# Patient Record
Sex: Male | Born: 1955 | Race: White | Hispanic: No | Smoking: Never smoker
Health system: Southern US, Community
[De-identification: ages and names within clinical notes are randomized; demographics above are authoritative.]

## PROBLEM LIST (undated history)

## (undated) DIAGNOSIS — R972 Elevated prostate specific antigen [PSA]: Secondary | ICD-10-CM

## (undated) DIAGNOSIS — R011 Cardiac murmur, unspecified: Secondary | ICD-10-CM

## (undated) DIAGNOSIS — E785 Hyperlipidemia, unspecified: Secondary | ICD-10-CM

## (undated) DIAGNOSIS — D72819 Decreased white blood cell count, unspecified: Secondary | ICD-10-CM

## (undated) DIAGNOSIS — G5600 Carpal tunnel syndrome, unspecified upper limb: Secondary | ICD-10-CM

## (undated) DIAGNOSIS — I10 Essential (primary) hypertension: Secondary | ICD-10-CM

## (undated) HISTORY — DX: Elevated prostate specific antigen (PSA): R97.20

## (undated) HISTORY — DX: Carpal tunnel syndrome, unspecified upper limb: G56.00

## (undated) HISTORY — DX: Decreased white blood cell count, unspecified: D72.819

## (undated) HISTORY — DX: Cardiac murmur, unspecified: R01.1

## (undated) HISTORY — PX: ROTATOR CUFF REPAIR: SHX139

## (undated) HISTORY — DX: Essential (primary) hypertension: I10

## (undated) HISTORY — DX: Hyperlipidemia, unspecified: E78.5

## (undated) HISTORY — PX: KNEE ARTHROSCOPY: SUR90

---
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
## 2009-09-05 ENCOUNTER — Encounter: Admission: RE | Admit: 2009-09-05 | Discharge: 2009-09-05 | Payer: Self-pay | Admitting: *Deleted

## 2012-03-18 ENCOUNTER — Encounter: Payer: Self-pay | Admitting: *Deleted

## 2013-10-22 ENCOUNTER — Ambulatory Visit (INDEPENDENT_AMBULATORY_CARE_PROVIDER_SITE_OTHER): Payer: BC Managed Care – PPO | Admitting: Urology

## 2013-10-22 ENCOUNTER — Encounter (INDEPENDENT_AMBULATORY_CARE_PROVIDER_SITE_OTHER): Payer: Self-pay

## 2013-10-22 DIAGNOSIS — N400 Enlarged prostate without lower urinary tract symptoms: Secondary | ICD-10-CM

## 2015-07-16 ENCOUNTER — Ambulatory Visit
Admission: EM | Admit: 2015-07-16 | Discharge: 2015-07-16 | Disposition: A | Discharge status: Home / Self Care | Payer: BLUE CROSS/BLUE SHIELD | Attending: Internal Medicine | Admitting: Internal Medicine

## 2015-07-16 ENCOUNTER — Ambulatory Visit: Payer: BLUE CROSS/BLUE SHIELD

## 2015-07-16 ENCOUNTER — Encounter: Payer: Self-pay | Admitting: *Deleted

## 2015-07-16 DIAGNOSIS — S99811A Other specified injuries of right ankle, initial encounter: Secondary | ICD-10-CM | POA: Diagnosis present

## 2015-07-16 DIAGNOSIS — I38 Endocarditis, valve unspecified: Secondary | ICD-10-CM | POA: Insufficient documentation

## 2015-07-16 DIAGNOSIS — I10 Essential (primary) hypertension: Secondary | ICD-10-CM | POA: Insufficient documentation

## 2015-07-16 DIAGNOSIS — S92101A Unspecified fracture of right talus, initial encounter for closed fracture: Secondary | ICD-10-CM | POA: Diagnosis not present

## 2015-07-16 DIAGNOSIS — W11XXXA Fall on and from ladder, initial encounter: Secondary | ICD-10-CM | POA: Insufficient documentation

## 2015-07-16 DIAGNOSIS — R972 Elevated prostate specific antigen [PSA]: Secondary | ICD-10-CM | POA: Diagnosis not present

## 2015-07-16 DIAGNOSIS — G5600 Carpal tunnel syndrome, unspecified upper limb: Secondary | ICD-10-CM | POA: Insufficient documentation

## 2015-07-16 DIAGNOSIS — E785 Hyperlipidemia, unspecified: Secondary | ICD-10-CM | POA: Insufficient documentation

## 2015-07-16 MED ORDER — IBUPROFEN 800 MG PO TABS
800.0000 mg | ORAL_TABLET | Freq: Once | ORAL | Status: AC
  Administered 2015-07-16: 800 mg via ORAL

## 2015-07-16 MED ORDER — NAPROXEN 500 MG PO TABS: 500.0000 mg | ORAL_TABLET | Freq: Two times a day (BID) | ORAL | Status: DC

## 2015-07-16 MED ORDER — HYDROCODONE-ACETAMINOPHEN 5-325 MG PO TABS: 2.0000 | ORAL_TABLET | ORAL | Status: DC | PRN

## 2015-07-16 NOTE — ED Notes (Signed)
Pt states "fell from ladder about 30 minutes ago and injured right ankle and scratched left leg"

## 2015-07-16 NOTE — Discharge Instructions (Signed)
Do not put weight on your right ankle until cleared by orthopedist to do so. Use crutches and wear splint at all times. Ice and elevation should help manage swelling/pain. Prescriptions for naprosyn and a small number (10) of vicodin tablets were given today, for pain. Followup in the next few days with [facility] to discuss further management of right ankle fracture.

## 2015-07-16 NOTE — ED Provider Notes (Signed)
CSN: [id number]     Arrival date & time 07/16/15  1501 History   First MD Initiated Contact with Patient 07/16/15 1544     Chief Complaint  Patient presents with  . Ankle Injury   HPI  Patient is a 59 year old gentleman who was up about 10 feet on a ladder, working on a tree branches, earlier today. The ladder slid, fell, striking a fence and patient landed with most of his weight on his right foot. Ankle is painful and swollen, and he is not able to bear weight. He has some scraped areas on both legs, and a swollen area on the left mid shin. He did not hit his head, and has no neck or back pain.  Past Medical History  Diagnosis Date  . Hypertension   . Coarctation of aorta     age 36  . Hyperlipidemia   . Leukopenia     mild  . Elevated PSA   . Heart murmur     systolic  . Carpal tunnel syndrome    Past Surgical History  Procedure Laterality Date  . Knee arthroscopy    . Rotator cuff repair      right  . Coarctation of aorta repair  06/1962   Family History  Problem Relation Age of Onset  . Pancreatic cancer Mother    History  Substance Use Topics  . Smoking status: Never Smoker   . Smokeless tobacco: Not on file  . Alcohol Use: 1.2 oz/week    1 Glasses of wine, 1 Cans of beer per week    Review of Systems  All other systems reviewed and are negative.   Allergies  Review of patient's allergies indicates no known allergies.  Home Medications  Takes no medications regularly  BP 140/78 mmHg  Pulse 62  Temp(Src) 98.1 F (36.7 C) (Oral)  Ht 5\' 10"  (1.778 m)  Wt 165 lb (74.844 kg)  BMI 23.68 kg/m2  SpO2 99% Physical Exam  Constitutional: He is oriented to person, place, and time. No distress.  Alert, nicely groomed  HENT:  Head: Atraumatic.  No hemotympanum, no bleeding from the nose.  Patient reports that teeth feel like they're in the right place. No injury to lips or tongue.  Eyes:  Conjugate gaze, no eye redness/drainage  Neck: Neck supple.   Cardiovascular: Normal rate.   Pulmonary/Chest: No respiratory distress.  Abdominal: Soft. He exhibits no distension.  Musculoskeletal: Normal range of motion.  Good range of motion of the right ankle, but swollen Focal tenderness over the medial malleolus, and around the lateral malleolus 3 inch hematoma over the left mid shin Range of motion of both shoulders, elbows, wrists, knees, hips, appears to be full and without pain No midline posterior tenderness to percussion along the neck or spine  Neurological: He is alert and oriented to person, place, and time.  Skin: Skin is warm and dry.  No cyanosis  Nursing note and vitals reviewed.   ED Course  Procedures (including critical care time) Labs Review Labs Reviewed - No data to display  Imaging Review Dg Tibia/fibula Left  07/16/2015   CLINICAL DATA:  Status post fall.  Left mid tibial/fib bruising.  EXAM: LEFT TIBIA AND FIBULA - 2 VIEW  COMPARISON:  None.  FINDINGS: There is no evidence of fracture or other focal bone lesions. Soft tissues are unremarkable.  IMPRESSION: No acute osseous injury of the left tibia or fibula.   Electronically Signed   By: Elige Ko  On: 07/16/2015 16:26   Dg Ankle Complete Right  07/16/2015   CLINICAL DATA:  Fall from ladder.  EXAM: RIGHT ANKLE - COMPLETE 3+ VIEW  COMPARISON:  None.  FINDINGS: There is a mildly displaced fracture of the lateral corner of the talar dome. There is no other fracture or dislocation. The soft tissues are normal.  IMPRESSION: Mildly displaced fracture of the lateral corner of the talar dome.   Electronically Signed   By: Elige Ko   On: 07/16/2015 16:26   orthoglass posterior splint applied to R ankle per nursing staff.  MDM   1. Talar fracture, right, closed, initial encounter    Pt is to be non weight bearing until cleared by orthopedist to bear weight. Wear splint and use crutches; followup with [facility] in the next few days. Copy of xray on disc  given to patient; rx for naproxen and small number (10 tablets) of vicodin given. Ice and elevation should help decrease swelling and pain.   Eustace Moore, MD 07/16/15 [phone]

## 2015-07-18 ENCOUNTER — Ambulatory Visit
Admission: RE | Admit: 2015-07-18 | Discharge: 2015-07-18 | Disposition: A | Discharge status: Home / Self Care | Payer: BLUE CROSS/BLUE SHIELD | Source: Ambulatory Visit | Attending: Sports Medicine | Admitting: Sports Medicine

## 2015-07-18 ENCOUNTER — Other Ambulatory Visit: Payer: Self-pay | Admitting: Sports Medicine

## 2015-07-18 DIAGNOSIS — S82891A Other fracture of right lower leg, initial encounter for closed fracture: Secondary | ICD-10-CM

## 2016-04-21 IMAGING — CT CT ANKLE*R* W/O CM
3 series · 8 of 14 positions shown, 9 images · non-contrast
Comparison: None.

CLINICAL DATA: Close fracture of the right talus.

EXAM:
CT OF THE RIGHT ANKLE WITHOUT CONTRAST
TECHNIQUE: Multidetector CT imaging of the right ankle was performed according
to the standard protocol. Multiplanar CT image reconstructions were
also generated.

[Series 4: soft tissue lower extremity · axial · 0.37mm/px · z∈[+151,+247]mm · 4 of 80 slices shown]
[im 16/80  soft-tissue]
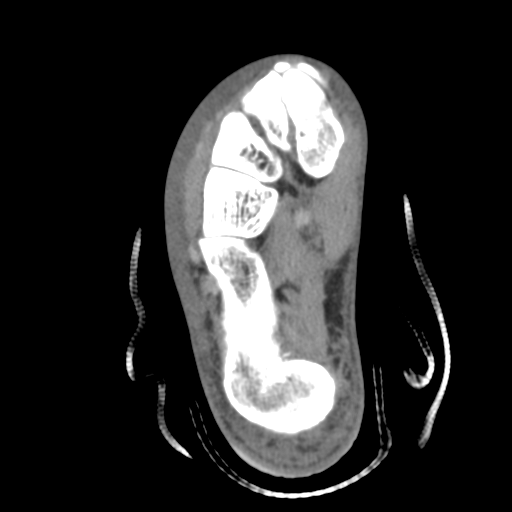
[im 32/80  soft-tissue]
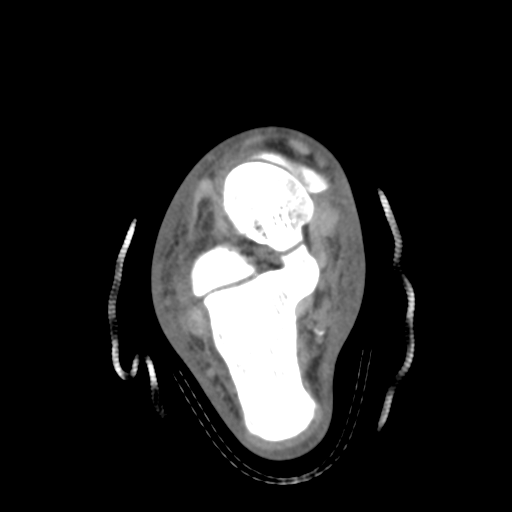
[im 48/80  soft-tissue]
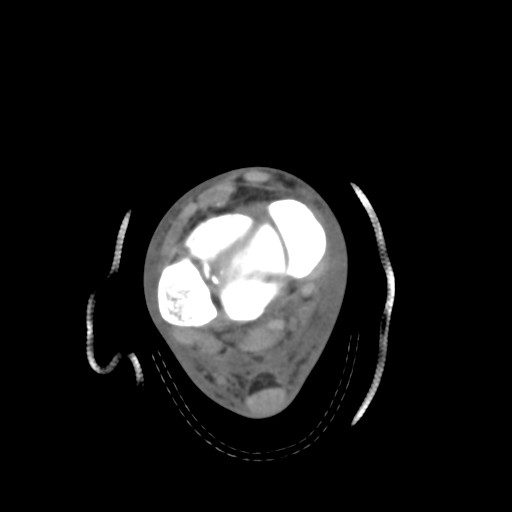
[im 64/80  soft-tissue]
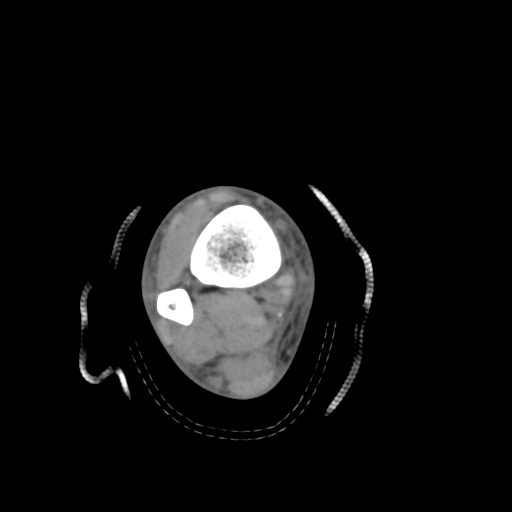

[Series 602: axial · axial · 0.37mm/px · z∈[+154,+194]mm · 2 of 60 slices shown (1 of 2)]
[im 20/60  bone]
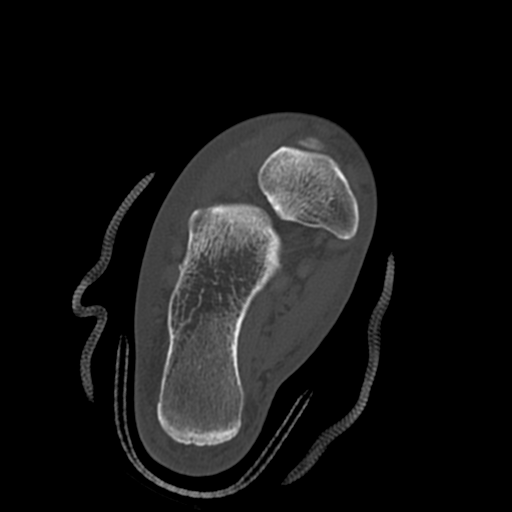
[im 40/60  bone]
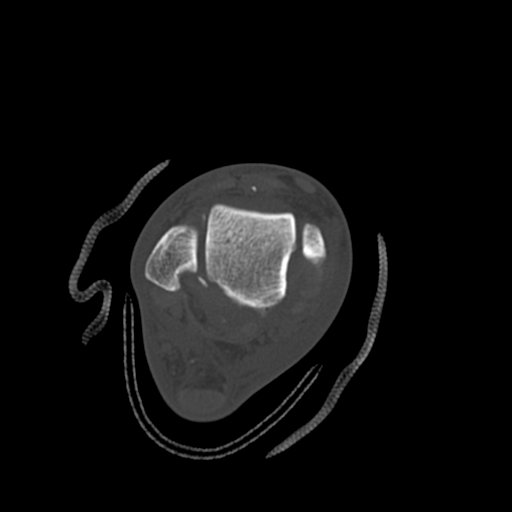

[Series 603: axial · axial · 0.37mm/px · z∈[+152,+194]mm · 2 of 64 slices shown, 3 images (2 of 2)]
[im 22/64  soft-tissue]
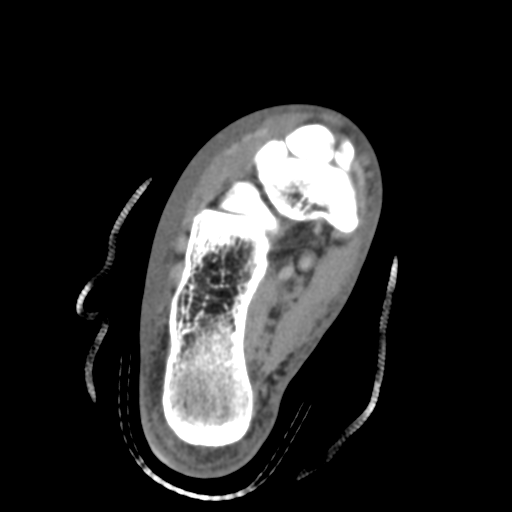
[im 22/64  bone]
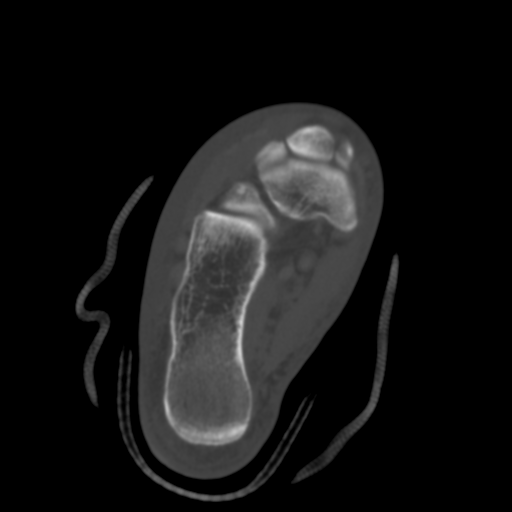
[im 43/64  bone]
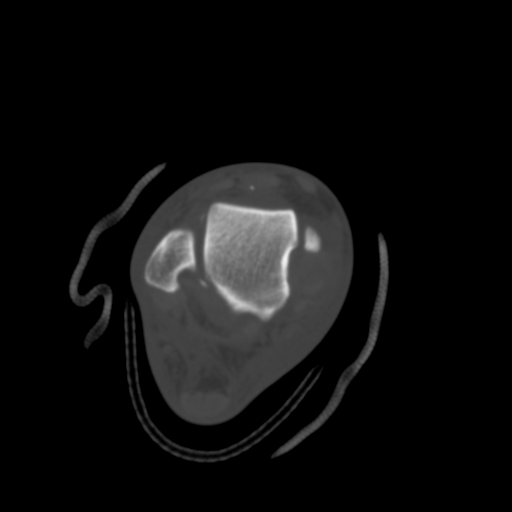

[8 of 14 positions shown; findings below may reference images not displayed]

FINDINGS: There is a mildly comminuted fracture of the lateral corner of the
talar dome with 2 mm of depression of the articular surface. There
is no other fracture or dislocation. There is a punctate loose body
displaced into the posterior tibiotalar joint. There is a moderate
ankle joint effusion.

There is no other fracture or dislocation. The joint spaces are
maintained. Ankle mortise is maintained. The subtalar joints are
normal. There is a small plantar calcaneal spur.

There is circumferential soft tissue edema around the ankle. There
is peripheral vascular atherosclerotic disease. The flexor, extensor
and peroneal tendons are grossly intact.
IMPRESSION: 1. Mildly comminuted fracture of the lateral corner of the talar
dome with 2 mm of depression of the articular surface.

## 2019-11-26 ENCOUNTER — Other Ambulatory Visit: Payer: Self-pay

## 2019-11-26 DIAGNOSIS — Z20822 Contact with and (suspected) exposure to covid-19: Secondary | ICD-10-CM

## 2019-11-28 LAB — NOVEL CORONAVIRUS, NAA: SARS-CoV-2, NAA: NOT DETECTED

## 2019-11-29 ENCOUNTER — Telehealth: Payer: Self-pay

## 2019-11-29 NOTE — Telephone Encounter (Signed)
Patient given negative result and verbalized understanding  

## 2020-02-02 ENCOUNTER — Other Ambulatory Visit: Payer: Self-pay

## 2020-02-02 ENCOUNTER — Ambulatory Visit: Payer: BLUE CROSS/BLUE SHIELD | Attending: Internal Medicine

## 2020-02-02 DIAGNOSIS — Z20822 Contact with and (suspected) exposure to covid-19: Secondary | ICD-10-CM

## 2020-02-03 ENCOUNTER — Telehealth: Payer: Self-pay | Admitting: *Deleted

## 2020-02-03 ENCOUNTER — Ambulatory Visit: Payer: Self-pay

## 2020-02-03 LAB — NOVEL CORONAVIRUS, NAA: SARS-CoV-2, NAA: NOT DETECTED

## 2020-02-03 NOTE — Telephone Encounter (Signed)
Incoming call form Patient requesting covid testing results.  Not available.

## 2020-02-03 NOTE — Telephone Encounter (Signed)
Pt calling with questions regarding covid results. Confirmed 'not detected' negative. Pt verbalizes understanding.

## 2021-01-05 ENCOUNTER — Other Ambulatory Visit: Payer: Medicare Other

## 2021-01-05 DIAGNOSIS — Z20822 Contact with and (suspected) exposure to covid-19: Secondary | ICD-10-CM

## 2021-01-09 LAB — NOVEL CORONAVIRUS, NAA: SARS-CoV-2, NAA: DETECTED — AB

## 2021-01-10 ENCOUNTER — Telehealth: Payer: Self-pay

## 2021-01-10 NOTE — Telephone Encounter (Signed)
Called to discuss with patient about COVID-19 symptoms and the use of one of the available treatments for those with mild to moderate Covid symptoms and at Harvey high risk of hospitalization.  Pt appears to qualify for outpatient treatment due to co-morbid conditions and/or Harvey member of an at-risk group in accordance with the FDA Emergency Use Authorization.    Symptom onset: 01/04/21 Vaccinated: No Booster? No Immunocompromised? No Qualifiers: None  Unable to reach pt - Reached pt.   Austin Harvey   Pt. Out of 7 day window for treatment. States he is feeling better.

## 2022-02-05 ENCOUNTER — Other Ambulatory Visit: Payer: Self-pay | Admitting: Sports Medicine

## 2022-04-18 ENCOUNTER — Encounter: Payer: Self-pay | Admitting: Cardiology

## 2022-04-18 ENCOUNTER — Ambulatory Visit (INDEPENDENT_AMBULATORY_CARE_PROVIDER_SITE_OTHER): Payer: Medicare Other | Admitting: Cardiology

## 2022-04-18 DIAGNOSIS — Q251 Coarctation of aorta: Secondary | ICD-10-CM | POA: Diagnosis not present

## 2022-04-18 DIAGNOSIS — I2510 Atherosclerotic heart disease of native coronary artery without angina pectoris: Secondary | ICD-10-CM | POA: Diagnosis not present

## 2022-04-18 DIAGNOSIS — Z01810 Encounter for preprocedural cardiovascular examination: Secondary | ICD-10-CM | POA: Diagnosis not present

## 2022-04-18 DIAGNOSIS — I2584 Coronary atherosclerosis due to calcified coronary lesion: Secondary | ICD-10-CM

## 2022-04-18 NOTE — Assessment & Plan Note (Signed)
He may proceed with total hip replacement with low overall cardiac risk.  He is able to complete 4 METS of activity without difficulty. ?

## 2022-04-18 NOTE — Assessment & Plan Note (Signed)
I recommended aspirin 81 mg as well as Crestor 10 mg.  Would recheck lipid panel in 2 months after starting Crestor.  Explained and plaque stabilization.  Heart attack reduction.  At this time he may consider aspirin 81 he states.  He will hold off on Crestor.  Please consider in the future. ?

## 2022-04-18 NOTE — Assessment & Plan Note (Signed)
CT scan from 2020 reviewed as above.  Mild narrowing of the aorta noted.  No murmurs heard on exam.  Pulses are equal upper and lower body.  No excessive hypertension.  I do not hear a aortic valve murmur.  Offered him another evaluation with CT scan.  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  At this time he declined.  We could also use echocardiogram to look at the aortic valve structure.  At this time he declined.  He believes that he has had an ultrasound of the aorta done in the past. ?

## 2022-04-18 NOTE — Patient Instructions (Signed)
Medication Instructions:  Your physician recommends that you continue on your current medications as directed. Please refer to the Current Medication list given to you today.   Labwork: None today  Testing/Procedures: None today  Follow-Up: As needed  Any Other Special Instructions Will Be Listed Below (If Applicable).  If you need a refill on your cardiac medications before your next appointment, please call your pharmacy.  

## 2022-04-18 NOTE — Progress Notes (Signed)
?Cardiology Office Note:   ? ?Date:  04/18/2022  ? ?ID:  Austin RanchScott W Bier, DOB 05-23-1956, MRN [id number] ? ?PCP:  Patient, No Pcp Per (Inactive) ?  ?[facility] Providers ?Cardiologist:  None    ? ?Referring MD: Linus Galasate, Katie, NP  ? ? ?History of Present Illness:   ? ?Austin Harvey is a [age] y.o. male here for preoperative risk assessment prior to left total hip arthroplasty by Dr. Lequita HaltAluisio.  Denies any chest pain syncope palpitations fatigue.  Prior EKG was performed and showed nonspecific changes when compared to 2021.  ST segment changes were noted in V5.  We were asked to assess cardiac risk prior to surgery. ? ?He is doing very well.  Able to complete greater than 4 METS of activity without any difficulty. ? ?EKG on repeat today with no significant abnormalities.  Subtle J-point elevation noted in the precordial leads.  Benign. ? ?[place] age 66 [place]  CT scan was performed in 2010.  Personally reviewed and interpreted.  Showed him images.  Mild narrowing, 25%, aortic diameter just after the left subclavian.  Correct repair intact.  I do not hear any murmurs on exam.  I do not hear an aortic murmur.  I did mention bicuspid aortic valve to him, he knew of the topic.  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 ? ?He is also had a coronary calcium score performed at [facility], score was 75 which was between 25th and 50th percentile.  Calcium was in the LAD and circumflex.  We discussed potential treatment strategies including aspirin as well as Crestor.  His sister who has a high LDL and is very athletic has a score of 0. ? ?Past Medical History:  ?Diagnosis Date  ? Carpal tunnel syndrome   ? Coarctation of aorta   ? age 89  ? Elevated PSA   ? Heart murmur   ? systolic  ? Hyperlipidemia   ? Hypertension   ? Leukopenia   ? mild  ? ? ?Past Surgical History:  ?Procedure Laterality Date  ? COARCTATION OF AORTA REPAIR  06/1962  ? KNEE  ARTHROSCOPY    ? ROTATOR CUFF REPAIR    ? right  ? ? ?Current Medications: ?No outpatient medications have been marked as taking for the 04/18/22 encounter (Office Visit) with Jake BatheSkains,  C, MD.  ?  ? ?Allergies:   Patient has no known allergies.  ? ?Social History  ? ?Socioeconomic History  ? Marital status: Divorced  ?  Spouse name: Not on file  ? Number of children: Not on file  ? Years of education: Not on file  ? Highest education level: Not on file  ?Occupational History  ? Not on file  ?Tobacco Use  ? Smoking status: Never  ? Smokeless tobacco: Not on file  ?Vaping Use  ? Vaping Use: Never used  ?Substance and Sexual Activity  ? Alcohol use: Yes  ?  Alcohol/week: 2.0 standard drinks  ?  Types: 1 Glasses of wine, 1 Cans of beer per week  ? Drug use: No  ? Sexual activity: Not on file  ?Other Topics Concern  ? Not on file  ?Social History Narrative  ? Not on file  ? ?Social Determinants of Health  ? ?Financial Resource Strain: Not on file  ?Food Insecurity: Not on file  ?Transportation Needs: Not on file  ?Physical Activity: Not on file  ?Stress: Not on file  ?  Social Connections: Not on file  ?  ? ?Family History: ?The patient's family history includes Cancer in his father; Pancreatic cancer in his mother; Stroke in his father. ? ?ROS:   ?Please see the history of present illness.    ? All other systems reviewed and are negative. ? ?EKGs/Labs/Other Studies Reviewed:   ? ?The following studies were reviewed today: ?As above ? ?EKG:  EKG is  ordered today.  The ekg ordered today demonstrates sinus rhythm 68 with subtle J-point elevation precordial leads ? ?Recent Labs: ?No results found for requested labs within last 8760 hours.  ?Recent Lipid Panel ?No results found for: CHOL, TRIG, HDL, CHOLHDL, VLDL, LDLCALC, LDLDIRECT ? ? ?Risk Assessment/Calculations:   ? ? ?    ? ?   ? ?Physical Exam:   ? ?VS:  BP 140/80   Pulse 68   Ht 5\' 10"  (1.778 m)   Wt 151 lb 9.6 oz (68.8 kg)   SpO2 98%   BMI 21.75 kg/m?    ? ?Wt  Readings from Last 3 Encounters:  ?04/18/22 151 lb 9.6 oz (68.8 kg)  ?07/16/15 165 lb (74.8 kg)  ?  ? ?GEN: Well nourished, well developed in no acute distress ?HEENT: Normal ?NECK: No JVD; No carotid bruits ?LYMPHATICS: No lymphadenopathy ?CARDIAC: RRR, no murmurs, no rubs, gallops ?RESPIRATORY:  Clear to auscultation without rales, wheezing or rhonchi  ?ABDOMEN: Soft, non-tender, non-distended ?MUSCULOSKELETAL:  No edema; No deformity  ?SKIN: Warm and dry ?NEUROLOGIC:  Alert and oriented x 3 ?PSYCHIATRIC:  Normal affect  ? ?ASSESSMENT:   ? ?1. Preop cardiovascular exam   ?2. Coronary artery calcification   ?3. Coarctation of the aorta, complex   ? ?PLAN:   ? ?In order of problems listed above: ? ?Preop cardiovascular exam ?He may proceed with total hip replacement with low overall cardiac risk.  He is able to complete 4 METS of activity without difficulty. ? ?Coronary artery calcification ?I recommended aspirin 81 mg as well as Crestor 10 mg.  Would recheck lipid panel in 2 months after starting Crestor.  Explained and plaque stabilization.  Heart attack reduction.  At this time he may consider aspirin 81 he states.  He will hold off on Crestor.  Please consider in the future. ? ?Coarctation of the aorta, complex ?CT scan from 2020 reviewed as above.  Mild narrowing of the aorta noted.  No murmurs heard on exam.  Pulses are equal upper and lower body.  No excessive hypertension.  I do not hear a aortic valve murmur.  Offered him another evaluation with CT scan.  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  At this time he declined.  We could also use echocardiogram to look at the aortic valve structure.  At this time he declined.  He believes that he has had an ultrasound of the aorta done in the past. ?  ? ?Please let us know if  we can be of further assistance. ?  ? ? ?Medication Adjustments/Labs and Tests Ordered: ?Current medicines are reviewed at length with the patient today.  Concerns regarding medicines are outlined above.  ?No orders of the defined types were placed in this encounter. ? ?No orders of the defined types were placed in this encounter. ? ? ?Patient Instructions  ?  Medication Instructions:  ?Your physician recommends that you continue on your current medications as directed. Please refer to the Current Medication list given to you today. ? ? ?Labwork: ?None today ? ?Testing/Procedures: ?None today ? ?Follow-Up: ?As needed ? ?Any Other Special Instructions Will Be Listed Below (If Applicable). ? ?If you need a refill on your cardiac medications before your next appointment, please call your pharmacy. ?  ? ?Signed, ?Donato Schultz, MD  ?04/18/2022 2:37 PM    ?[facility] ?

## 2022-11-19 ENCOUNTER — Telehealth: Payer: Self-pay

## 2022-11-19 NOTE — Telephone Encounter (Signed)
...     Pre-operative Risk Assessment    Patient Name: Austin Harvey  DOB: Dec 04, 1956 MRN: [id number]      Request for Surgical Clearance    Procedure:   left total hip arthroplasty Left total hip arthroplasty Date of Surgery:  Clearance 12/24/22                                 Surgeon:  DR Ollen Gross Surgeon's Group or Practice Name:  [facility] Phone number:  [phone] Fax number:  [phone]   Type of Clearance Requested:   - Medical    Type of Anesthesia:   CHOICE   Additional requests/questions:    Jola Babinski   11/19/2022, 12:57 PM

## 2022-11-20 NOTE — Telephone Encounter (Signed)
Left message to call back for appt. Per pre op provider if pt would like in office appt that is fine or we can set up a tele pre op appt.

## 2022-11-20 NOTE — Telephone Encounter (Signed)
   Name: Austin Harvey  DOB: Nov 08, 1956  MRN: [id number]  Primary Cardiologist: None  Chart reviewed as part of pre-operative protocol coverage. Because of Austin Harvey's past medical history and time since last visit, he will require a follow-up phone-office visit in order to better assess preoperative cardiovascular risk. (Could be in office if he prefers)  Pre-op covering staff: - Please schedule appointment and call patient to inform them. If patient already had an upcoming appointment within acceptable timeframe, please add "pre-op clearance" to the appointment notes so provider is aware. - Please contact requesting surgeon's office via preferred method (i.e, phone, fax) to inform them of need for appointment prior to surgery.   Alver Sorrow, NP  11/20/2022, 8:38 AM

## 2022-11-25 ENCOUNTER — Telehealth: Payer: Self-pay | Admitting: *Deleted

## 2022-11-25 NOTE — Telephone Encounter (Signed)
I s/w the pt and he has been scheduled for a tele pre op appt 12/03/22 @ 9:20. Med rec and consent are done.     Patient Consent for Virtual Visit        Kroy Sprung Sevcik has provided verbal consent on 11/25/2022 for a virtual visit (video or telephone).   CONSENT FOR VIRTUAL VISIT FOR:  Bing Neighbors Malter  By participating in this virtual visit I agree to the following:  I hereby voluntarily request, consent and authorize [facility] and its employed or contracted physicians, physician assistants, nurse practitioners or other licensed health care professionals (the Practitioner), to provide me with telemedicine health care services (the "Services") as deemed necessary by the treating Practitioner. I acknowledge and consent to receive the Services by the Practitioner via telemedicine. I understand that the telemedicine visit will involve communicating with the Practitioner through live audiovisual communication technology and the disclosure of certain medical information by electronic transmission. I acknowledge that I have been given the opportunity to request an in-person assessment or other available alternative prior to the telemedicine visit and am voluntarily participating in the telemedicine visit.  I understand that I have the right to withhold or withdraw my consent to the use of telemedicine in the course of my care at any time, without affecting my right to future care or treatment, and that the Practitioner or I may terminate the telemedicine visit at any time. I understand that I have the right to inspect all information obtained and/or recorded in the course of the telemedicine visit and may receive copies of available information for a reasonable fee.  I understand that some of the potential risks of receiving the Services via telemedicine include:  Delay or interruption in medical evaluation due to technological equipment failure or disruption; Information transmitted may  not be sufficient (e.g. poor resolution of images) to allow for appropriate medical decision making by the Practitioner; and/or  In rare instances, security protocols could fail, causing a breach of personal health information.  Furthermore, I acknowledge that it is my responsibility to provide information about my medical history, conditions and care that is complete and accurate to the best of my ability. I acknowledge that Practitioner's advice, recommendations, and/or decision may be based on factors not within their control, such as incomplete or inaccurate data provided by me or distortions of diagnostic images or specimens that may result from electronic transmissions. I understand that the practice of medicine is not an exact science and that Practitioner makes no warranties or guarantees regarding treatment outcomes. I acknowledge that a copy of this consent can be made available to me via my patient portal [facility] MyChart), or I can request a printed copy by calling the office of [facility].    I understand that my insurance will be billed for this visit.   I have read or had this consent read to me. I understand the contents of this consent, which adequately explains the benefits and risks of the Services being provided via telemedicine.  I have been provided ample opportunity to ask questions regarding this consent and the Services and have had my questions answered to my satisfaction. I give my informed consent for the services to be provided through the use of telemedicine in my medical care

## 2022-11-25 NOTE — Telephone Encounter (Signed)
I s/w the pt and he has been scheduled for a tele pre op appt 12/03/22 @ 9:20. Med rec and consent are done.

## 2022-12-02 NOTE — Telephone Encounter (Signed)
Left a message for the pt to call the office as we need to reschedule his tele pre op appt. Provider is out of the office due to a death in the family.

## 2022-12-03 ENCOUNTER — Telehealth: Payer: BLUE CROSS/BLUE SHIELD

## 2022-12-03 NOTE — Telephone Encounter (Signed)
LATE ENTRY:    I s/w the pt and he has been scheduled for a tele pre op appt 12/03/22 @ 9:20. Med rec and consent are done.

## 2022-12-04 NOTE — Progress Notes (Unsigned)
Virtual Visit via Telephone Note   Because of Austin Harvey's co-morbid illnesses, he is at least at moderate risk for complications without adequate follow up.  This format is felt to be most appropriate for this patient at this time.  The patient did not have access to video technology/had technical difficulties with video requiring transitioning to audio format only (telephone).  All issues noted in this document were discussed and addressed.  No physical exam could be performed with this format.  Please refer to the patient's chart for his consent to telehealth for [facility]  Evaluation Performed:  Preoperative cardiovascular risk assessment _____________   Date:  12/05/2022   Patient ID:  Austin Harvey, DOB 02-10-1956, MRN [id number] Patient Location:  Home Provider location:   Office  Primary Care Provider:  Patient, No Pcp Per Primary Cardiologist:  Donato Schultz, MD  Chief Complaint / Patient Profile   66 y.o. y/o male with a h/o EKG with ST segment changes in V5 referred to cardiology to assess cardiac risk prior to surgery in April 2023 defined as subtle J-point elevation noted in precordial leads (benign) by Dr. Anne Fu, coarctation repair at age 85, coronary calcium score of 75 with calcium identified in LAD and circumflex, who is pending left total hip arthoplasty and presents today for telephonic preoperative cardiovascular risk assessment.  History of Present Illness    Austin Harvey is a 66 y.o. male who presents via audio/video conferencing for a telehealth visit today.  Pt was last seen in cardiology clinic on 04/18/22 by Dr. Anne Fu.  At that time Austin Harvey was doing well.  The patient is now pending procedure as outlined above. Since his last visit, he denies chest pain, shortness of breath, lower extremity edema, fatigue, palpitations, melena, hematuria, hemoptysis, diaphoresis, weakness, presyncope, syncope, orthopnea, and PND. He just  returned from a hiking trip and is easily able to achieve > 4 METS activity with no cardiac symptoms.   Past Medical History    Past Medical History:  Diagnosis Date   Carpal tunnel syndrome    Coarctation of aorta    age 1   Elevated PSA    Heart murmur    systolic   Hyperlipidemia    Hypertension    Leukopenia    mild   Past Surgical History:  Procedure Laterality Date   COARCTATION OF AORTA REPAIR  06/1962   KNEE ARTHROSCOPY     ROTATOR CUFF REPAIR     right    Allergies  No Known Allergies  Home Medications    Prior to Admission medications   Medication Sig Start Date End Date Taking? Authorizing Provider  HYDROcodone-acetaminophen (NORCO/VICODIN) 5-325 MG per tablet Take 2 tablets by mouth every 4 (four) hours as needed. Patient not taking: Reported on 04/18/2022 07/16/15   Isa Rankin, MD    Physical Exam    Vital Signs:  Austin Harvey does not have vital signs available for review today.  Given telephonic nature of communication, physical exam is limited. AAOx3. NAD. Normal affect.  Speech and respirations are unlabored.  Accessory Clinical Findings    None  Assessment & Plan    1.  Preoperative Cardiovascular Risk Assessment: The patient is doing well from a cardiac perspective. Therefore, based on ACC/AHA guidelines, the patient would be at acceptable risk for the planned procedure without further cardiovascular testing. According to the Revised Cardiac Risk Index (RCRI), his Perioperative Risk of Major Cardiac Event is (%):  0.9 His Functional Capacity in METs is: 7.59 according to the Duke Activity Status Index (DASI).  The patient was advised that if he develops new symptoms prior to surgery to contact our office to arrange for a follow-up visit, and he verbalized understanding.   A copy of this note will be routed to requesting surgeon.  Time:   Today, I have spent 5 minutes with the patient with telehealth technology discussing  medical history, symptoms, and management plan.    Austin Aland, NP-C  12/05/2022, 9:19 AM [address] Office [phone] Fax [phone]

## 2022-12-05 ENCOUNTER — Encounter: Payer: Self-pay | Admitting: Nurse Practitioner

## 2022-12-05 ENCOUNTER — Ambulatory Visit: Payer: Medicare Other | Attending: Internal Medicine | Admitting: Nurse Practitioner

## 2022-12-05 DIAGNOSIS — Z01810 Encounter for preprocedural cardiovascular examination: Secondary | ICD-10-CM

## 2022-12-06 ENCOUNTER — Other Ambulatory Visit (HOSPITAL_BASED_OUTPATIENT_CLINIC_OR_DEPARTMENT_OTHER): Payer: Self-pay | Admitting: Internal Medicine

## 2022-12-06 ENCOUNTER — Other Ambulatory Visit: Payer: Self-pay | Admitting: Internal Medicine

## 2022-12-06 DIAGNOSIS — R221 Localized swelling, mass and lump, neck: Secondary | ICD-10-CM

## 2022-12-12 ENCOUNTER — Ambulatory Visit (HOSPITAL_BASED_OUTPATIENT_CLINIC_OR_DEPARTMENT_OTHER): Payer: Medicare Other

## 2022-12-12 ENCOUNTER — Encounter (HOSPITAL_BASED_OUTPATIENT_CLINIC_OR_DEPARTMENT_OTHER): Payer: Self-pay

## 2022-12-14 ENCOUNTER — Encounter (HOSPITAL_BASED_OUTPATIENT_CLINIC_OR_DEPARTMENT_OTHER): Payer: Self-pay | Admitting: Emergency Medicine

## 2022-12-14 ENCOUNTER — Emergency Department (HOSPITAL_BASED_OUTPATIENT_CLINIC_OR_DEPARTMENT_OTHER)
Admission: EM | Admit: 2022-12-14 | Discharge: 2022-12-14 | Discharge status: Against Medical Advice | Payer: Medicare Other | Attending: Emergency Medicine | Admitting: Emergency Medicine

## 2022-12-14 DIAGNOSIS — R059 Cough, unspecified: Secondary | ICD-10-CM | POA: Diagnosis present

## 2022-12-14 DIAGNOSIS — B974 Respiratory syncytial virus as the cause of diseases classified elsewhere: Secondary | ICD-10-CM | POA: Insufficient documentation

## 2022-12-14 DIAGNOSIS — Z20822 Contact with and (suspected) exposure to covid-19: Secondary | ICD-10-CM | POA: Diagnosis not present

## 2022-12-14 DIAGNOSIS — J988 Other specified respiratory disorders: Secondary | ICD-10-CM | POA: Diagnosis not present

## 2022-12-14 LAB — RESP PANEL BY RT-PCR (RSV, FLU A&B, COVID)  RVPGX2
Influenza A by PCR: NEGATIVE
Influenza B by PCR: NEGATIVE
Resp Syncytial Virus by PCR: POSITIVE — AB
SARS Coronavirus 2 by RT PCR: NEGATIVE

## 2022-12-14 NOTE — ED Triage Notes (Signed)
Pt here from home with c/o cough times one week , no fevers or sob

## 2023-01-03 ENCOUNTER — Ambulatory Visit (HOSPITAL_BASED_OUTPATIENT_CLINIC_OR_DEPARTMENT_OTHER)
Admission: RE | Admit: 2023-01-03 | Discharge: 2023-01-03 | Disposition: A | Discharge status: Home / Self Care | Payer: Medicare Other | Source: Ambulatory Visit | Attending: Internal Medicine | Admitting: Internal Medicine

## 2023-01-03 DIAGNOSIS — R221 Localized swelling, mass and lump, neck: Secondary | ICD-10-CM | POA: Diagnosis present

## 2023-01-03 MED ORDER — IOHEXOL 300 MG/ML  SOLN
100.0000 mL | Freq: Once | INTRAMUSCULAR | Status: AC | PRN
  Administered 2023-01-03: 75 mL via INTRAVENOUS

## 2023-02-25 ENCOUNTER — Ambulatory Visit (HOSPITAL_BASED_OUTPATIENT_CLINIC_OR_DEPARTMENT_OTHER): Payer: Medicare Other | Admitting: Physical Therapy

## 2023-03-19 ENCOUNTER — Ambulatory Visit (HOSPITAL_BASED_OUTPATIENT_CLINIC_OR_DEPARTMENT_OTHER): Payer: Medicare Other | Admitting: Physical Therapy

## 2023-03-19 ENCOUNTER — Encounter (HOSPITAL_BASED_OUTPATIENT_CLINIC_OR_DEPARTMENT_OTHER): Payer: Self-pay

## 2023-07-01 ENCOUNTER — Telehealth: Payer: Self-pay | Admitting: *Deleted

## 2023-07-01 NOTE — Telephone Encounter (Signed)
   Name: LEXTON HIDALGO  DOB: 1956-04-27  MRN: [id number]  Primary Cardiologist: Donato Schultz, MD  Chart reviewed as part of pre-operative protocol coverage. Because of Arieh Bogue Matsuoka's past medical history and time since last visit, he will require a follow-up in-office visit in order to better assess preoperative cardiovascular risk.  Pre-op covering staff: - Please schedule appointment and call patient to inform them. If patient already had an upcoming appointment within acceptable timeframe, please add "pre-op clearance" to the appointment notes so provider is aware. - Please contact requesting surgeon's office via preferred method (i.e, phone, fax) to inform them of need for appointment prior to surgery.   Carlos Levering, NP  07/01/2023, 2:56 PM

## 2023-07-01 NOTE — Telephone Encounter (Signed)
   Pre-operative Risk Assessment    Patient Name: Austin Harvey  DOB: March 16, 1956 MRN: [id number]      Request for Surgical Clearance    Procedure:   LEFT TOTAL HIP ARTHROPLASTY  Date of Surgery:  Clearance 09/30/23                                 Surgeon:  DR. Ollen Gross Surgeon's Group or Practice Name:  [facility] Phone number:  [phone] ATTN: Aida Raider Fax number:  [phone]   Type of Clearance Requested:   - Medical ; NO MEDICATIONS LISTED AS NEEDING TO BE HELD   Type of Anesthesia:   CHOICE   Additional requests/questions:    [name]   07/01/2023, 12:46 PM

## 2023-07-02 NOTE — Telephone Encounter (Signed)
1st attempt to reach pt regarding surgical clearance and the need for an IN-OFFICE appointment.  Left pt a message to call back and get that scheduled. 

## 2023-07-03 NOTE — Telephone Encounter (Signed)
2nd attempt to reach pt regarding surgical clearance and the need for an IN-OFFICE appointment.  Left pt a message to call back and get that scheduled.

## 2023-07-04 NOTE — Telephone Encounter (Signed)
Patient has been scheduled for an in office appointment for pre op clearance on 07/25/23. Will route back to requesting surgeons office to make them aware.

## 2023-07-24 NOTE — Progress Notes (Signed)
Office Visit    Patient Name: CAMDON SAETERN Date of Encounter: 07/24/2023  Primary Care Provider:  Merri Brunette, MD Primary Cardiologist:  Donato Schultz, MD Primary Electrophysiologist: None   Past Medical History    Past Medical History:  Diagnosis Date   Carpal tunnel syndrome    Coarctation of aorta    age 67   Elevated PSA    Heart murmur    systolic   Hyperlipidemia    Hypertension    Leukopenia    mild   Past Surgical History:  Procedure Laterality Date   COARCTATION OF AORTA REPAIR  06/1962   KNEE ARTHROSCOPY     ROTATOR CUFF REPAIR     right    Allergies  No Known Allergies   History of Present Illness    QUINCEY QUESINBERRY  is a 67 year old male with a PMH of congenital abnormality s/p coarctation repair at age 31, HLD, HTN, systolic murmur, SCC of left upper neck mass who presents today for preoperative clearance visit.  Mr. Sol Passer was seen initially by Dr. Anne Fu in 03/2022 for preoperative clearance visit prior to left total hip replacement.  Today he presents for preoperative clearance for hip replacement.  Patient had most recent calcium score completed at [facility] with a score of 75 he was noted to have some J-point elevations in precordial leads that were determined to be benign.  He was diagnosed with squamous cell carcinoma of left tonsil and underwent tonsillectomy neck node dissection with most recent PET/CT showing no evidence of recurrence.  Since last being seen in the office patient reports doing well with no new cardiac complaints since his previous visit.  He is scheduled to undergo left total hip replacement.  He was previously scheduled to undergo procedure but had other health issues that occurred causing rescheduling.  His blood pressure today is controlled at 124/62 and HR is 58 BPM.  He is staying active but is limited with pain.  He is currently not on any additional supplements or vitamins.  Patient denies chest pain, palpitations,  dyspnea, PND, orthopnea, nausea, vomiting, dizziness, syncope, edema, weight gain, or early satiety.    No current outpatient medications on file.   No current facility-administered medications for this visit.     Review of Systems  Please see the history of present illness.    (+) Left hip pain  All other systems reviewed and are otherwise negative except as noted above.  Physical Exam    Wt Readings from Last 3 Encounters:  04/18/22 151 lb 9.6 oz (68.8 kg)  07/16/15 165 lb (74.8 kg)   ZO:[id number] were no vitals filed for this visit.,There is no height or weight on file to calculate BMI.  Constitutional:      Appearance: Healthy appearance. Not in distress.  Neck:     Vascular: JVD normal.  Pulmonary:     Effort: Pulmonary effort is normal.     Breath sounds: No wheezing. No rales. Diminished in the bases Cardiovascular:     Normal rate. Regular rhythm. Normal S1. Normal S2.      Murmurs: There is no murmur.  Edema:    Peripheral edema absent.  Abdominal:     Palpations: Abdomen is soft non tender. There is no hepatomegaly.  Skin:    General: Skin is warm and dry.  Neurological:     General: No focal deficit present.     Mental Status: Alert and oriented to person, place and  time.     Cranial Nerves: Cranial nerves are intact.  EKG/LABS/ Recent Cardiac Studies    ECG personally reviewed by me today -sinus bradycardia with first-degree AVB J-point elevation in leads V4, V5 and V6 which are benign and consistent with previous EKG.    No results found for: "WBC", "HGB", "HCT", "MCV", "PLT" No results found for: "CREATININE", "BUN", "NA", "K", "CL", "CO2" No results found for: "ALT", "AST", "GGT", "ALKPHOS", "BILITOT" No results found for: "CHOL", "HDL", "LDLCALC", "LDLDIRECT", "TRIG", "CHOLHDL"  No results found for: "HGBA1C"   Assessment & Plan    1.  Preoperative cardiac clearance: -Patient's RCRI score is 0.9%  The patient affirms he has been doing well without  any new cardiac symptoms. They are able to achieve 7 METS without cardiac limitations. Therefore, based on ACC/AHA guidelines, the patient would be at acceptable risk for the planned procedure without further cardiovascular testing. The patient was advised that if he develops new symptoms prior to surgery to contact our office to arrange for a follow-up visit, and he verbalized understanding.   2.  History of coarctation repair: -Completed at age 35 in [place], [place] -Today patient is doing well with no new cardiac complaints.  3.  History of SCC of tonsil: -s/p left tonsillectomy with node dissection most recent PET/CT showing no evidence of recurrence. -Followed by oncology  4.  Essential hypertension: -Patient's blood pressure today was well-controlled at 124/62  5.  Hyperlipidemia: -Patient's last LDL cholesterol was previously elevated at 122 and currently followed by PCP  Disposition: Follow-up with Donato Schultz, MD or APP in as needed months    Medication Adjustments/Labs and Tests Ordered: Current medicines are reviewed at length with the patient today.  Concerns regarding medicines are outlined above.   Signed, Napoleon Form, Leodis Rains, NP 07/24/2023, 9:59 AM [facility]

## 2023-07-25 ENCOUNTER — Encounter: Payer: Self-pay | Admitting: Nurse Practitioner

## 2023-07-25 ENCOUNTER — Ambulatory Visit: Payer: Medicare Other | Attending: Nurse Practitioner | Admitting: Nurse Practitioner

## 2023-07-25 VITALS — BP 124/62 | HR 58 | Ht 70.0 in | Wt 151.0 lb

## 2023-07-25 DIAGNOSIS — I2510 Atherosclerotic heart disease of native coronary artery without angina pectoris: Secondary | ICD-10-CM | POA: Insufficient documentation

## 2023-07-25 DIAGNOSIS — Z01810 Encounter for preprocedural cardiovascular examination: Secondary | ICD-10-CM | POA: Insufficient documentation

## 2023-07-25 DIAGNOSIS — R9431 Abnormal electrocardiogram [ECG] [EKG]: Secondary | ICD-10-CM | POA: Insufficient documentation

## 2023-07-25 DIAGNOSIS — C099 Malignant neoplasm of tonsil, unspecified: Secondary | ICD-10-CM | POA: Diagnosis present

## 2023-07-25 DIAGNOSIS — I2584 Coronary atherosclerosis due to calcified coronary lesion: Secondary | ICD-10-CM | POA: Diagnosis present

## 2023-07-25 DIAGNOSIS — Q251 Coarctation of aorta: Secondary | ICD-10-CM | POA: Insufficient documentation

## 2023-07-25 NOTE — Patient Instructions (Signed)
Medication Instructions:  Your physician recommends that you continue on your current medications as directed. Please refer to the Current Medication list given to you today. *If you need a refill on your cardiac medications before your next appointment, please call your pharmacy*   Lab Work: None ordered   Testing/Procedures: None ordered   Follow-Up: At [facility], you and your health needs are our priority.  As part of our continuing mission to provide you with exceptional heart care, we have created designated Provider Care Teams.  These Care Teams include your primary Cardiologist (physician) and Advanced Practice Providers (APPs -  Physician Assistants and Nurse Practitioners) who all work together to provide you with the care you need, when you need it.  We recommend signing up for the patient portal called "MyChart".  Sign up information is provided on this After Visit Summary.  MyChart is used to connect with patients for Virtual Visits (Telemedicine).  Patients are able to view lab/test results, encounter notes, upcoming appointments, etc.  Non-urgent messages can be sent to your provider as well.   To learn more about what you can do with MyChart, go to [email or web address].    Your next appointment:   As needed   Provider:   Donato Schultz, MD     Other Instructions

## 2023-11-12 ENCOUNTER — Other Ambulatory Visit (HOSPITAL_COMMUNITY): Payer: Self-pay | Admitting: Internal Medicine

## 2023-11-12 DIAGNOSIS — I2510 Atherosclerotic heart disease of native coronary artery without angina pectoris: Secondary | ICD-10-CM

## 2023-12-19 ENCOUNTER — Other Ambulatory Visit: Payer: Self-pay

## 2023-12-19 ENCOUNTER — Ambulatory Visit (HOSPITAL_BASED_OUTPATIENT_CLINIC_OR_DEPARTMENT_OTHER): Payer: Medicare Other | Attending: Student | Admitting: Physical Therapy

## 2023-12-19 ENCOUNTER — Encounter (HOSPITAL_BASED_OUTPATIENT_CLINIC_OR_DEPARTMENT_OTHER): Payer: Self-pay | Admitting: Physical Therapy

## 2023-12-19 DIAGNOSIS — M25652 Stiffness of left hip, not elsewhere classified: Secondary | ICD-10-CM | POA: Insufficient documentation

## 2023-12-19 DIAGNOSIS — M25552 Pain in left hip: Secondary | ICD-10-CM | POA: Insufficient documentation

## 2023-12-19 NOTE — Therapy (Unsigned)
OUTPATIENT PHYSICAL THERAPY LOWER EXTREMITY EVALUATION   Patient Name: Austin Harvey MRN: [id number] DOB:Feb 12, 1956, 67 y.o., male Today's Date: 12/19/2023  END OF SESSION:  PT End of Session - 12/19/23 1635     Visit Number 1    Number of Visits 8    Date for PT Re-Evaluation 02/13/24    PT Start Time 1150    PT Stop Time 1237    PT Time Calculation (min) 47 min    Activity Tolerance Patient tolerated treatment well    Behavior During Therapy WFL for tasks assessed/performed             Past Medical History:  Diagnosis Date   Carpal tunnel syndrome    Coarctation of aorta    age 26   Elevated PSA    Heart murmur    systolic   Hyperlipidemia    Hypertension    Leukopenia    mild   Past Surgical History:  Procedure Laterality Date   COARCTATION OF AORTA REPAIR  06/1962   KNEE ARTHROSCOPY     ROTATOR CUFF REPAIR     right   Patient Active Problem List   Diagnosis Date Noted   Preop cardiovascular exam 04/18/2022   Coronary artery calcification 04/18/2022   Coarctation of the aorta, complex 04/18/2022    PCP: Merri Brunette MD  REFERRING PROVIDER: Eartha Inch PA   REFERRING DIAG: Right hip replacement   THERAPY DIAG:  Pain in left hip  Stiffness of left hip, not elsewhere classified  Rationale for Evaluation and Treatment: Rehabilitation  ONSET DATE: Post op anterior hip 09/30/2023   SUBJECTIVE:   SUBJECTIVE STATEMENT:   PERTINENT HISTORY: Bilateral knee OA, heart murmur, bilateral knee scope,  PAIN:  Are you having pain? Yes: NPRS scale: when it hurts 3-4/10  Pain location: left anterior hip  Pain description: aching  Aggravating factors: walking; abduction  Relieving factors: not doing activity that hurts   PRECAUTIONS: None  RED FLAGS: None   WEIGHT BEARING RESTRICTIONS: No  FALLS:  Has patient fallen in last 6 months? No  LIVING ENVIRONMENT: Steps to the bonus room. Since replacement steps are better.     OCCUPATION:  Retired   Presenter, broadcasting:  Riding the bike  Exercises int he gym      PLOF: Independent  PATIENT GOALS:    NEXT MD VISIT: Nothing at this time   OBJECTIVE:  Note: Objective measures were completed at Evaluation unless otherwise noted.  DIAGNOSTIC FINDINGS:   PATIENT SURVEYS:  FOTO    COGNITION: Overall cognitive status: Within functional limits for tasks assessed     SENSATION: Radiating pain into lateral and anterior hip     POSTURE: No Significant postural limitations  PALPATION: TTP in glut med and minimus  Mild TTP in lumbar spine    LOWER EXTREMITY ROM:  Passive ROM Right eval Left eval  Hip flexion  115 degrees with mild pain  Hip extension    Hip abduction    Hip adduction    Hip internal rotation  Full range but painful  Hip external rotation  Mild endrange restriction  Knee flexion    Knee extension    Ankle dorsiflexion    Ankle plantarflexion    Ankle inversion    Ankle eversion     (Blank rows = not tested)  LOWER EXTREMITY MMT:  MMT Right eval Left eval  Hip flexion 37.5 33.0  Hip extension    Hip abduction 43.3 37.4  Hip adduction  Hip internal rotation    Hip external rotation    Knee flexion    Knee extension 59.3 61.2  Ankle dorsiflexion    Ankle plantarflexion    Ankle inversion    Ankle eversion     (Blank rows = not tested)    GAIT: Normal gait  Single-leg stance equal left and right                                                                                                                              TREATMENT DATE:  Access Code: [email or web address] URL: [email or web address] Date: 12/19/2023 Prepared by: Lorayne Bender  Exercises - Supine Lower Trunk Rotation  - 1 x daily - 7 x weekly - 3 sets - 10 reps - Supine Piriformis Stretch with Foot on Ground  - 1 x daily - 7 x weekly - 3 sets - 3 reps - 20 sec  hold - Supine March  - 1 x daily - 7 x weekly - 3 sets - 10 reps - Supine  Bridge with Resistance Band  - 1 x daily - 7 x weekly - 3 sets - 10 reps - Standing Glute Med Mobilization with Small Ball on Wall  - 1 x daily - 7 x weekly - 3 sets - 10 reps     PATIENT EDUCATION:  Education details: reviewed HEP, self soft tissue mobilization  Person educated: Patient Education method: Explanation, Demonstration, Tactile cues, Verbal cues, and Handouts Education comprehension: verbalized understanding, returned demonstration, verbal cues required, tactile cues required, and needs further education  HOME EXERCISE PROGRAM: Access Code: [email or web address] URL: [email or web address] Date: 12/19/2023 Prepared by: Lorayne Bender  Exercises - Supine Lower Trunk Rotation  - 1 x daily - 7 x weekly - 3 sets - 10 reps - Supine Piriformis Stretch with Foot on Ground  - 1 x daily - 7 x weekly - 3 sets - 3 reps - 20 sec  hold - Supine March  - 1 x daily - 7 x weekly - 3 sets - 10 reps - Supine Bridge with Resistance Band  - 1 x daily - 7 x weekly - 3 sets - 10 reps - Standing Glute Med Mobilization with Small Ball on Wall  - 1 x daily - 7 x weekly - 3 sets - 10 reps  ASSESSMENT:  CLINICAL IMPRESSION: Patient is a 67 year old male status post left anterior hip replacement on 09/30/2023.  He continues to have left-sided lateral hip and anterior hip pain.  The pain increases with standing and walking.  He also has increased pain with active hip abduction. He has spasming in his glut min and med. He has mild pain in his hip with lumbar extension and left right lumbar rotation. He would beneift from skilled therapy to reduce pain and .improve ability to continue active lifestyle.   OBJECTIVE IMPAIRMENTS: decreased activity tolerance, decreased ROM, decreased strength, increased muscle spasms, and pain.   ACTIVITY LIMITATIONS: bending, standing, squatting,  and getting on a bke.   PARTICIPATION LIMITATIONS: community activity, yard work, and going to the gym   PERSONAL FACTORS: 1  comorbidity: bilateral knee surgery   are also affecting patient's functional outcome.   REHAB POTENTIAL: Excellent  CLINICAL DECISION MAKING: Stable/uncomplicated  EVALUATION COMPLEXITY: Low   GOALS: Goals reviewed with patient? Yes  SHORT TERM GOALS: Target date: 01/16/2024   Patient will demonstrate full hip IR on the left with Baseline: Goal status: INITIAL  2.  Patient will demonstrate pain free lumbar extension and rotation  Baseline:  Goal status: INITIAL  3.  Patient will demonstrate equal hip abduction strength left versus right Baseline:  Goal status: INITIAL  4.  Patient will have baseline stretching and strengthening program Baseline:  Goal status: INITIAL   LONG TERM GOALS: Target date: 02/13/2024    Patient will walk community distance without increased left hip pain Baseline:  Goal status: INITIAL  2.  Patient will have full gym program Baseline:  Goal status: INITIAL  3.  Patient will demonstrate full pain-free range of motion of left hip in order to perform ADLs Baseline:   PLAN:  PT FREQUENCY: 1-2x/week  PT DURATION: 8 weeks  PLANNED INTERVENTIONS: 97110-Therapeutic exercises, 97530- Therapeutic activity, [id number]- Neuromuscular re-education, 97535- Self Care, 16109- Manual therapy, [id number]- Gait training, [phone]- Aquatic Therapy, 97014- Electrical stimulation (unattended), 97035- Ultrasound, Patient/Family education, Stair training, Taping, Dry Needling, DME instructions, Cryotherapy, and Moist heat   PLAN FOR NEXT SESSION: Next visit consider trigger point dry needling to gluteal and lower lumbar spine.  Progress exercises as tolerated.  Consider pal off press.  Consider rotational strengthening exercises. Review leg press.  Consider cone drill.  Consider cable walk.  Patient is a gym member.  Dessie Coma, PT 12/19/2023, 4:36 PM

## 2023-12-25 ENCOUNTER — Ambulatory Visit (HOSPITAL_BASED_OUTPATIENT_CLINIC_OR_DEPARTMENT_OTHER): Payer: Medicare Other | Attending: Student | Admitting: Physical Therapy

## 2023-12-25 ENCOUNTER — Encounter (HOSPITAL_BASED_OUTPATIENT_CLINIC_OR_DEPARTMENT_OTHER): Payer: Self-pay | Admitting: Physical Therapy

## 2023-12-25 DIAGNOSIS — M25652 Stiffness of left hip, not elsewhere classified: Secondary | ICD-10-CM | POA: Insufficient documentation

## 2023-12-25 DIAGNOSIS — M25552 Pain in left hip: Secondary | ICD-10-CM | POA: Insufficient documentation

## 2023-12-25 NOTE — Therapy (Signed)
 OUTPATIENT PHYSICAL THERAPY LOWER EXTREMITY EVALUATION   Patient Name: Austin Harvey MRN: [id number] DOB:July 25, 1956, 68 y.o., male Today's Date: 12/25/2023  END OF SESSION:  PT End of Session - 12/25/23 1011     Visit Number 2    Number of Visits 8    Date for PT Re-Evaluation 02/14/24    PT Start Time 0943   Therapy started the session late   PT Stop Time 1015    PT Time Calculation (min) 32 min    Activity Tolerance Patient tolerated treatment well    Behavior During Therapy [facility] for tasks assessed/performed             Past Medical History:  Diagnosis Date   Carpal tunnel syndrome    Coarctation of aorta    age 33   Elevated PSA    Heart murmur    systolic   Hyperlipidemia    Hypertension    Leukopenia    mild   Past Surgical History:  Procedure Laterality Date   COARCTATION OF AORTA REPAIR  06/1962   KNEE ARTHROSCOPY     ROTATOR CUFF REPAIR     right   Patient Active Problem List   Diagnosis Date Noted   Preop cardiovascular exam 04/18/2022   Coronary artery calcification 04/18/2022   Coarctation of the aorta, complex 04/18/2022    PCP: Ryan Hives MD  REFERRING PROVIDER: Asberry Kobs PA   REFERRING DIAG: Right hip replacement   THERAPY DIAG:  No diagnosis found.  Rationale for Evaluation and Treatment: Rehabilitation  ONSET DATE: Post op anterior hip 09/30/2023   SUBJECTIVE:   SUBJECTIVE STATEMENT:   PERTINENT HISTORY: Bilateral knee OA, heart murmur, bilateral knee scope,  PAIN:  Are you having pain? Yes: NPRS scale: when it hurts 3-4/10  Pain location: left anterior hip  Pain description: aching  Aggravating factors: walking; abduction  Relieving factors: not doing activity that hurts   PRECAUTIONS: None  RED FLAGS: None   WEIGHT BEARING RESTRICTIONS: No  FALLS:  Has patient fallen in last 6 months? No  LIVING ENVIRONMENT: Steps to the bonus room. Since replacement steps are better.    OCCUPATION:  Retired    Presenter, Broadcasting:  Riding the bike  Exercises int he gym      PLOF: Independent  PATIENT GOALS:    NEXT MD VISIT: Nothing at this time   OBJECTIVE:  Note: Objective measures were completed at Evaluation unless otherwise noted.  DIAGNOSTIC FINDINGS:   PATIENT SURVEYS:  FOTO    COGNITION: Overall cognitive status: Within functional limits for tasks assessed     SENSATION: Radiating pain into lateral and anterior hip     POSTURE: No Significant postural limitations  PALPATION: TTP in glut med and minimus  Mild TTP in lumbar spine    LOWER EXTREMITY ROM:  Passive ROM Right eval Left eval  Hip flexion  115 degrees with mild pain  Hip extension    Hip abduction    Hip adduction    Hip internal rotation  Full range but painful  Hip external rotation  Mild endrange restriction  Knee flexion    Knee extension    Ankle dorsiflexion    Ankle plantarflexion    Ankle inversion    Ankle eversion     (Blank rows = not tested)  LOWER EXTREMITY MMT:  MMT Right eval Left eval  Hip flexion 37.5 33.0  Hip extension    Hip abduction 43.3 37.4  Hip adduction    Hip  internal rotation    Hip external rotation    Knee flexion    Knee extension 59.3 61.2  Ankle dorsiflexion    Ankle plantarflexion    Ankle inversion    Ankle eversion     (Blank rows = not tested)    GAIT: Normal gait  Single-leg stance equal left and right                                                                                                                              TREATMENT DATE:   Trigger Point Dry Needling  Initial Treatment: Pt instructed on Dry Needling rational, procedures, and possible side effects. Pt instructed to expect mild to moderate muscle soreness later in the day and/or into the next day.  Pt instructed in methods to reduce muscle soreness. Pt instructed to continue prescribed HEP.  Patient Verbal Consent Given: Yes Education Handout Provided: Yes Muscles  Treated: glut medius and min 2x using a .30x50 needle  Electrical Stimulation Performed: No Treatment Response/Outcome: great twitch   Bridge with band 2x20 green  Hip abduction red 2x20  Reviewed glute stretch        Eval  Access Code: [email or web address] URL: [email or web address] Date: 12/19/2023 Prepared by: Alm Don  Exercises - Supine Lower Trunk Rotation  - 1 x daily - 7 x weekly - 3 sets - 10 reps - Supine Piriformis Stretch with Foot on Ground  - 1 x daily - 7 x weekly - 3 sets - 3 reps - 20 sec  hold - Supine March  - 1 x daily - 7 x weekly - 3 sets - 10 reps - Supine Bridge with Resistance Band  - 1 x daily - 7 x weekly - 3 sets - 10 reps - Standing Glute Med Mobilization with Small Ball on Wall  - 1 x daily - 7 x weekly - 3 sets - 10 reps     PATIENT EDUCATION:  Education details: reviewed HEP, self soft tissue mobilization  Person educated: Patient Education method: Explanation, Demonstration, Tactile cues, Verbal cues, and Handouts Education comprehension: verbalized understanding, returned demonstration, verbal cues required, tactile cues required, and needs further education  HOME EXERCISE PROGRAM:    Access Code: [email or web address] URL: [email or web address] Date: 12/19/2023 Prepared by: Alm Don  Exercises - Supine Lower Trunk Rotation  - 1 x daily - 7 x weekly - 3 sets - 10 reps - Supine Piriformis Stretch with Foot on Ground  - 1 x daily - 7 x weekly - 3 sets - 3 reps - 20 sec  hold - Supine March  - 1 x daily - 7 x weekly - 3 sets - 10 reps - Supine Bridge with Resistance Band  - 1 x daily - 7 x weekly - 3 sets - 10 reps - Standing Glute Med Mobilization with Small Ball on Wall  - 1 x daily - 7 x weekly - 3 sets - 10 reps  ASSESSMENT:  CLINICAL IMPRESSION: The patient had a great twitch with needling. His trigger points are away from the are  that he has pain but the pain is around the insertion of the muscles . He had a great  twitch response. We reviewed the exercises that he should do to reduce any post needle soreness. We will continue to progress as tolerated.    Eval: Patient is a 68 year old male status post left anterior hip replacement on 09/30/2023.  He continues to have left-sided lateral hip and anterior hip pain.  The pain increases with standing and walking.  He also has increased pain with active hip abduction. He has spasming in his glut min and med. He has mild pain in his hip with lumbar extension and left right lumbar rotation. He would beneift from skilled therapy to reduce pain and .improve ability to continue active lifestyle.   OBJECTIVE IMPAIRMENTS: decreased activity tolerance, decreased ROM, decreased strength, increased muscle spasms, and pain.   ACTIVITY LIMITATIONS: bending, standing, squatting, and getting on a bke.   PARTICIPATION LIMITATIONS: community activity, yard work, and going to the gym   PERSONAL FACTORS: 1 comorbidity: bilateral knee surgery   are also affecting patient's functional outcome.   REHAB POTENTIAL: Excellent  CLINICAL DECISION MAKING: Stable/uncomplicated  EVALUATION COMPLEXITY: Low   GOALS: Goals reviewed with patient? Yes  SHORT TERM GOALS: Target date: 01/16/2024   Patient will demonstrate full hip IR on the left with Baseline: Goal status: INITIAL  2.  Patient will demonstrate pain free lumbar extension and rotation  Baseline:  Goal status: INITIAL  3.  Patient will demonstrate equal hip abduction strength left versus right Baseline:  Goal status: INITIAL  4.  Patient will have baseline stretching and strengthening program Baseline:  Goal status: INITIAL   LONG TERM GOALS: Target date: 02/13/2024    Patient will walk community distance without increased left hip pain Baseline:  Goal status: INITIAL  2.  Patient will have full gym program Baseline:  Goal status: INITIAL  3.  Patient will demonstrate full pain-free range of motion of  left hip in order to perform ADLs Baseline:   PLAN:  PT FREQUENCY: 1-2x/week  PT DURATION: 8 weeks  PLANNED INTERVENTIONS: 97110-Therapeutic exercises, 97530- Therapeutic activity, [id number]- Neuromuscular re-education, 97535- Self Care, [id number]- Manual therapy, [id number]- Gait training, [phone]- Aquatic Therapy, 97014- Electrical stimulation (unattended), 97035- Ultrasound, Patient/Family education, Stair training, Taping, Dry Needling, DME instructions, Cryotherapy, and Moist heat   PLAN FOR NEXT SESSION: Next visit consider trigger point dry needling to gluteal and lower lumbar spine.  Progress exercises as tolerated.  Consider pal off press.  Consider rotational strengthening exercises. Review leg press.  Consider cone drill.  Consider cable walk.  Patient is a gym member.  Alm JINNY Don, PT 12/25/2023, 10:15 AM

## 2023-12-31 ENCOUNTER — Ambulatory Visit (HOSPITAL_BASED_OUTPATIENT_CLINIC_OR_DEPARTMENT_OTHER): Payer: Medicare Other | Admitting: Physical Therapy

## 2023-12-31 DIAGNOSIS — M25652 Stiffness of left hip, not elsewhere classified: Secondary | ICD-10-CM

## 2023-12-31 DIAGNOSIS — M25552 Pain in left hip: Secondary | ICD-10-CM

## 2024-01-01 NOTE — Therapy (Signed)
 OUTPATIENT PHYSICAL THERAPY LOWER EXTREMITY EVALUATION   Patient Name: Austin Harvey MRN: [id number] DOB:Jul 10, 1956, 68 y.o., male Today's Date: 01/01/2024  END OF SESSION:  PT End of Session - 01/01/24 1038     Visit Number 3    Number of Visits 8    Date for PT Re-Evaluation 02/14/24    PT Start Time 1100    PT Stop Time 1143    PT Time Calculation (min) 43 min    Activity Tolerance Patient tolerated treatment well    Behavior During Therapy WFL for tasks assessed/performed              Past Medical History:  Diagnosis Date   Carpal tunnel syndrome    Coarctation of aorta    age 81   Elevated PSA    Heart murmur    systolic   Hyperlipidemia    Hypertension    Leukopenia    mild   Past Surgical History:  Procedure Laterality Date   COARCTATION OF AORTA REPAIR  06/1962   KNEE ARTHROSCOPY     ROTATOR CUFF REPAIR     right   Patient Active Problem List   Diagnosis Date Noted   Preop cardiovascular exam 04/18/2022   Coronary artery calcification 04/18/2022   Coarctation of the aorta, complex 04/18/2022    PCP: Ryan Hives MD  REFERRING PROVIDER: Asberry Kobs PA   REFERRING DIAG: Right hip replacement   THERAPY DIAG:  Pain in left hip  Stiffness of left hip, not elsewhere classified  Rationale for Evaluation and Treatment: Rehabilitation  ONSET DATE: Post op anterior hip 09/30/2023   SUBJECTIVE:   SUBJECTIVE STATEMENT: The patient reports improvement from the last visit. He was a little sore the next day but it improved after that.   PERTINENT HISTORY: Bilateral knee OA, heart murmur, bilateral knee scope,  PAIN:  Are you having pain? Yes: NPRS scale: when it hurts 3-4/10  Pain location: left anterior hip  Pain description: aching  Aggravating factors: walking; abduction  Relieving factors: not doing activity that hurts   PRECAUTIONS: None  RED FLAGS: None   WEIGHT BEARING RESTRICTIONS: No  FALLS:  Has patient fallen in last  6 months? No  LIVING ENVIRONMENT: Steps to the bonus room. Since replacement steps are better.    OCCUPATION:  Retired   Presenter, Broadcasting:  Riding the bike  Exercises int he gym      PLOF: Independent  PATIENT GOALS:    NEXT MD VISIT: Nothing at this time   OBJECTIVE:  Note: Objective measures were completed at Evaluation unless otherwise noted.  DIAGNOSTIC FINDINGS:   PATIENT SURVEYS:  FOTO    COGNITION: Overall cognitive status: Within functional limits for tasks assessed     SENSATION: Radiating pain into lateral and anterior hip     POSTURE: No Significant postural limitations  PALPATION: TTP in glut med and minimus  Mild TTP in lumbar spine    LOWER EXTREMITY ROM:  Passive ROM Right eval Left eval  Hip flexion  115 degrees with mild pain  Hip extension    Hip abduction    Hip adduction    Hip internal rotation  Full range but painful  Hip external rotation  Mild endrange restriction  Knee flexion    Knee extension    Ankle dorsiflexion    Ankle plantarflexion    Ankle inversion    Ankle eversion     (Blank rows = not tested)  LOWER EXTREMITY MMT:  MMT Right  eval Left eval  Hip flexion 37.5 33.0  Hip extension    Hip abduction 43.3 37.4  Hip adduction    Hip internal rotation    Hip external rotation    Knee flexion    Knee extension 59.3 61.2  Ankle dorsiflexion    Ankle plantarflexion    Ankle inversion    Ankle eversion     (Blank rows = not tested)    GAIT: Normal gait  Single-leg stance equal left and right                                                                                                                              TREATMENT DATE:  1/9  Nu-step for warm up  Slow march with right leg    Initial Treatment: Pt instructed on Dry Needling rational, procedures, and possible side effects. Pt instructed to expect mild to moderate muscle soreness later in the day and/or into the next day.  Pt instructed in  methods to reduce muscle soreness. Pt instructed to continue prescribed HEP.  Patient Verbal Consent Given: Yes Education Handout Provided: Yes Muscles Treated: glut medius and min 2x using a .30x50 needle  Electrical Stimulation Performed: No Treatment Response/Outcome: great twitch   Manual: skilled palpation of trigger points; trigger point release to medial and lateral hip.  Roller to anterior hip; review of self soft tissue mobilization.    Last visit:   Trigger Point Dry Needling  Initial Treatment: Pt instructed on Dry Needling rational, procedures, and possible side effects. Pt instructed to expect mild to moderate muscle soreness later in the day and/or into the next day.  Pt instructed in methods to reduce muscle soreness. Pt instructed to continue prescribed HEP.  Patient Verbal Consent Given: Yes Education Handout Provided: Yes Muscles Treated: glut medius and min 2x using a .30x50 needle  Electrical Stimulation Performed: No Treatment Response/Outcome: great twitch   Bridge with band 2x20 green  Hip abduction red 2x20  Reviewed glute stretch        Eval  Access Code: [email or web address] URL: [email or web address] Date: 12/19/2023 Prepared by: Alm Don  Exercises - Supine Lower Trunk Rotation  - 1 x daily - 7 x weekly - 3 sets - 10 reps - Supine Piriformis Stretch with Foot on Ground  - 1 x daily - 7 x weekly - 3 sets - 3 reps - 20 sec  hold - Supine March  - 1 x daily - 7 x weekly - 3 sets - 10 reps - Supine Bridge with Resistance Band  - 1 x daily - 7 x weekly - 3 sets - 10 reps - Standing Glute Med Mobilization with Small Ball on Wall  - 1 x daily - 7 x weekly - 3 sets - 10 reps     PATIENT EDUCATION:  Education details: reviewed HEP, self soft tissue mobilization  Person educated: Patient Education method: Explanation, Demonstration, Tactile cues, Verbal cues, and Handouts Education  comprehension: verbalized understanding, returned  demonstration, verbal cues required, tactile cues required, and needs further education  HOME EXERCISE PROGRAM:    Access Code: [email or web address] URL: [email or web address] Date: 12/19/2023 Prepared by: Alm Don  Exercises - Supine Lower Trunk Rotation  - 1 x daily - 7 x weekly - 3 sets - 10 reps - Supine Piriformis Stretch with Foot on Ground  - 1 x daily - 7 x weekly - 3 sets - 3 reps - 20 sec  hold - Supine March  - 1 x daily - 7 x weekly - 3 sets - 10 reps - Supine Bridge with Resistance Band  - 1 x daily - 7 x weekly - 3 sets - 10 reps - Standing Glute Med Mobilization with Small Ball on Wall  - 1 x daily - 7 x weekly - 3 sets - 10 reps  ASSESSMENT:  CLINICAL IMPRESSION: The patient continues to progress well. He had a great twitch with all needles We worked on rolling out the left anterior hip today. We added a lsow march into his exercise program. We gave him an updated HEP.   Eval: Patient is a 68 year old male status post left anterior hip replacement on 09/30/2023.  He continues to have left-sided lateral hip and anterior hip pain.  The pain increases with standing and walking.  He also has increased pain with active hip abduction. He has spasming in his glut min and med. He has mild pain in his hip with lumbar extension and left right lumbar rotation. He would beneift from skilled therapy to reduce pain and .improve ability to continue active lifestyle.   OBJECTIVE IMPAIRMENTS: decreased activity tolerance, decreased ROM, decreased strength, increased muscle spasms, and pain.   ACTIVITY LIMITATIONS: bending, standing, squatting, and getting on a bke.   PARTICIPATION LIMITATIONS: community activity, yard work, and going to the gym   PERSONAL FACTORS: 1 comorbidity: bilateral knee surgery   are also affecting patient's functional outcome.   REHAB POTENTIAL: Excellent  CLINICAL DECISION MAKING: Stable/uncomplicated  EVALUATION COMPLEXITY: Low   GOALS: Goals  reviewed with patient? Yes  SHORT TERM GOALS: Target date: 01/16/2024   Patient will demonstrate full hip IR on the left with Baseline: Goal status: INITIAL  2.  Patient will demonstrate pain free lumbar extension and rotation  Baseline:  Goal status: INITIAL  3.  Patient will demonstrate equal hip abduction strength left versus right Baseline:  Goal status: INITIAL  4.  Patient will have baseline stretching and strengthening program Baseline:  Goal status: INITIAL   LONG TERM GOALS: Target date: 02/13/2024    Patient will walk community distance without increased left hip pain Baseline:  Goal status: INITIAL  2.  Patient will have full gym program Baseline:  Goal status: INITIAL  3.  Patient will demonstrate full pain-free range of motion of left hip in order to perform ADLs Baseline:   PLAN:  PT FREQUENCY: 1-2x/week  PT DURATION: 8 weeks  PLANNED INTERVENTIONS: 97110-Therapeutic exercises, 97530- Therapeutic activity, [id number]- Neuromuscular re-education, 97535- Self Care, 02859- Manual therapy, [id number]- Gait training, [phone]- Aquatic Therapy, 97014- Electrical stimulation (unattended), 97035- Ultrasound, Patient/Family education, Stair training, Taping, Dry Needling, DME instructions, Cryotherapy, and Moist heat   PLAN FOR NEXT SESSION: Next visit consider trigger point dry needling to gluteal and lower lumbar spine.  Progress exercises as tolerated.  Consider pal off press.  Consider rotational strengthening exercises. Review leg press.  Consider cone drill.  Consider cable walk.  Patient is a gym member.  Alm JINNY Don, PT 01/01/2024, 11:13 AM

## 2024-01-22 ENCOUNTER — Ambulatory Visit (HOSPITAL_BASED_OUTPATIENT_CLINIC_OR_DEPARTMENT_OTHER): Payer: Medicare Other | Admitting: Physical Therapy

## 2024-01-22 DIAGNOSIS — M25552 Pain in left hip: Secondary | ICD-10-CM | POA: Diagnosis not present

## 2024-01-22 DIAGNOSIS — M25652 Stiffness of left hip, not elsewhere classified: Secondary | ICD-10-CM

## 2024-01-22 NOTE — Therapy (Signed)
OUTPATIENT PHYSICAL THERAPY LOWER EXTREMITY EVALUATION   Patient Name: Austin Harvey MRN: [id number] DOB:[birth year], 68 y.o., male Today's Date: 01/22/2024  END OF SESSION:     Past Medical History:  Diagnosis Date   Carpal tunnel syndrome    Coarctation of aorta    age 48   Elevated PSA    Heart murmur    systolic   Hyperlipidemia    Hypertension    Leukopenia    mild   Past Surgical History:  Procedure Laterality Date   COARCTATION OF AORTA REPAIR  06/1962   KNEE ARTHROSCOPY     ROTATOR CUFF REPAIR     right   Patient Active Problem List   Diagnosis Date Noted   Preop cardiovascular exam 04/18/2022   Coronary artery calcification 04/18/2022   Coarctation of the aorta, complex 04/18/2022    PCP: Merri Brunette MD  REFERRING PROVIDER: Eartha Inch PA   REFERRING DIAG: Right hip replacement   THERAPY DIAG:  No diagnosis found.  Rationale for Evaluation and Treatment: Rehabilitation  ONSET DATE: Post op anterior hip 09/30/2023   SUBJECTIVE:   SUBJECTIVE STATEMENT: The patient reports improvement from the last visit. He was a little sore the next day but it improved after that.   PERTINENT HISTORY: Bilateral knee OA, heart murmur, bilateral knee scope,  PAIN:  Are you having pain? Yes: NPRS scale: when it hurts 3-4/10  Pain location: left anterior hip  Pain description: aching  Aggravating factors: walking; abduction  Relieving factors: not doing activity that hurts   PRECAUTIONS: None  RED FLAGS: None   WEIGHT BEARING RESTRICTIONS: No  FALLS:  Has patient fallen in last 6 months? No  LIVING ENVIRONMENT: Steps to the bonus room. Since replacement steps are better.    OCCUPATION:  Retired   Presenter, broadcasting:  Riding the bike  Exercises int he gym      PLOF: Independent  PATIENT GOALS:    NEXT MD VISIT: Nothing at this time   OBJECTIVE:  Note: Objective measures were completed at Evaluation unless otherwise noted.  DIAGNOSTIC  FINDINGS:   PATIENT SURVEYS:  FOTO    COGNITION: Overall cognitive status: Within functional limits for tasks assessed     SENSATION: Radiating pain into lateral and anterior hip     POSTURE: No Significant postural limitations  PALPATION: TTP in glut med and minimus  Mild TTP in lumbar spine    LOWER EXTREMITY ROM:  Passive ROM Right eval Left eval  Hip flexion  115 degrees with mild pain  Hip extension    Hip abduction    Hip adduction    Hip internal rotation  Full range but painful  Hip external rotation  Mild endrange restriction  Knee flexion    Knee extension    Ankle dorsiflexion    Ankle plantarflexion    Ankle inversion    Ankle eversion     (Blank rows = not tested)  LOWER EXTREMITY MMT:  MMT Right eval Left eval  Hip flexion 37.5 33.0  Hip extension    Hip abduction 43.3 37.4  Hip adduction    Hip internal rotation    Hip external rotation    Knee flexion    Knee extension 59.3 61.2  Ankle dorsiflexion    Ankle plantarflexion    Ankle inversion    Ankle eversion     (Blank rows = not tested)    GAIT: Normal gait  Single-leg stance equal left and right  TREATMENT DATE:  Manual: All PROM performed with distraction to reduce pain and improve movement; skilled palpation of trigger points, Trigger point release to gluteals    Nuro-Re-ed   Eccentric step downs 2x10  2inch. Did well with hip but had pain in his knee Cone drill for rotational control x10  Reviewed how to progress and make more difficult.  Cable walk: 20 lbs RPE of 4 25 lbs REP of 6 Cuing for set up   Trigger Point Dry Needling  Initial Treatment: Pt instructed on Dry Needling rational, procedures, and possible side effects. Pt instructed to expect mild to moderate muscle soreness later in the day and/or into the next day.  Pt instructed in  methods to reduce muscle soreness. Pt instructed to continue prescribed HEP. Patient was educated on signs and symptoms of infection and other risk factors and advised to seek medical attention should they occur.  Patient verbalized understanding of these instructions and education.   Patient Verbal Consent Given: Yes Education Handout Provided: Glut med 3x using a .30x50 needle  Muscles Treated: Left gluteal Electrical Stimulation Performed: No Treatment Response/Outcome: great twitch      1/9  Nu-step for warm up  Slow march with right leg    Initial Treatment: Pt instructed on Dry Needling rational, procedures, and possible side effects. Pt instructed to expect mild to moderate muscle soreness later in the day and/or into the next day.  Pt instructed in methods to reduce muscle soreness. Pt instructed to continue prescribed HEP.  Patient Verbal Consent Given: Yes Education Handout Provided: Yes Muscles Treated: glut medius and min 2x using a .30x50 needle  Electrical Stimulation Performed: No Treatment Response/Outcome: great twitch   Manual: skilled palpation of trigger points; trigger point release to medial and lateral hip.  Roller to anterior hip; review of self soft tissue mobilization.    Last visit:   Trigger Point Dry Needling  Initial Treatment: Pt instructed on Dry Needling rational, procedures, and possible side effects. Pt instructed to expect mild to moderate muscle soreness later in the day and/or into the next day.  Pt instructed in methods to reduce muscle soreness. Pt instructed to continue prescribed HEP.  Patient Verbal Consent Given: Yes Education Handout Provided: Yes Muscles Treated: glut medius and min 2x using a .30x50 needle  Electrical Stimulation Performed: No Treatment Response/Outcome: great twitch   Bridge with band 2x20 green  Hip abduction red 2x20  Reviewed glute stretch        Eval  Access Code: [email or web address] URL:  [email or web address] Date: 12/19/2023 Prepared by: Lorayne Bender  Exercises - Supine Lower Trunk Rotation  - 1 x daily - 7 x weekly - 3 sets - 10 reps - Supine Piriformis Stretch with Foot on Ground  - 1 x daily - 7 x weekly - 3 sets - 3 reps - 20 sec  hold - Supine March  - 1 x daily - 7 x weekly - 3 sets - 10 reps - Supine Bridge with Resistance Band  - 1 x daily - 7 x weekly - 3 sets - 10 reps - Standing Glute Med Mobilization with Small Ball on Wall  - 1 x daily - 7 x weekly - 3 sets - 10 reps     PATIENT EDUCATION:  Education details: reviewed HEP, self soft tissue mobilization  Person educated: Patient Education method: Explanation, Demonstration, Tactile cues, Verbal cues, and Handouts Education comprehension: verbalized understanding, returned demonstration, verbal cues required, tactile cues required, and needs further  education  HOME EXERCISE PROGRAM:    Access Code: [email or web address] URL: [email or web address] Date: 12/19/2023 Prepared by: Lorayne Bender  Exercises - Supine Lower Trunk Rotation  - 1 x daily - 7 x weekly - 3 sets - 10 reps - Supine Piriformis Stretch with Foot on Ground  - 1 x daily - 7 x weekly - 3 sets - 3 reps - 20 sec  hold - Supine March  - 1 x daily - 7 x weekly - 3 sets - 10 reps - Supine Bridge with Resistance Band  - 1 x daily - 7 x weekly - 3 sets - 10 reps - Standing Glute Med Mobilization with Small Ball on Wall  - 1 x daily - 7 x weekly - 3 sets - 10 reps  ASSESSMENT:  CLINICAL IMPRESSION: The patient had the best twitch that we have gotten twith the needling. We advanced his exercise program. We emphasized control exercises for his hip. The exercises he was given were moderate to difficult. We talked to him about adjusting difficulty. He had more problem with his left knee with exercises. We will continue to progress as tolerated.  Eval: Patient is a 68 year old male status post left anterior hip replacement on  09/30/2023.  He continues to have left-sided lateral hip and anterior hip pain.  The pain increases with standing and walking.  He also has increased pain with active hip abduction. He has spasming in his glut min and med. He has mild pain in his hip with lumbar extension and left right lumbar rotation. He would beneift from skilled therapy to reduce pain and .improve ability to continue active lifestyle.   OBJECTIVE IMPAIRMENTS: decreased activity tolerance, decreased ROM, decreased strength, increased muscle spasms, and pain.   ACTIVITY LIMITATIONS: bending, standing, squatting, and getting on a bke.   PARTICIPATION LIMITATIONS: community activity, yard work, and going to the gym   PERSONAL FACTORS: 1 comorbidity: bilateral knee surgery   are also affecting patient's functional outcome.   REHAB POTENTIAL: Excellent  CLINICAL DECISION MAKING: Stable/uncomplicated  EVALUATION COMPLEXITY: Low   GOALS: Goals reviewed with patient? Yes  SHORT TERM GOALS: Target date: 01/16/2024   Patient will demonstrate full hip IR on the left with Baseline: Goal status: INITIAL  2.  Patient will demonstrate pain free lumbar extension and rotation  Baseline:  Goal status: INITIAL  3.  Patient will demonstrate equal hip abduction strength left versus right Baseline:  Goal status: INITIAL  4.  Patient will have baseline stretching and strengthening program Baseline:  Goal status: INITIAL   LONG TERM GOALS: Target date: 02/13/2024    Patient will walk community distance without increased left hip pain Baseline:  Goal status: INITIAL  2.  Patient will have full gym program Baseline:  Goal status: INITIAL  3.  Patient will demonstrate full pain-free range of motion of left hip in order to perform ADLs Baseline:   PLAN:  PT FREQUENCY: 1-2x/week  PT DURATION: 8 weeks  PLANNED INTERVENTIONS: 97110-Therapeutic exercises, 97530- Therapeutic activity, [id number]- Neuromuscular re-education,  97535- Self Care, [id number]- Manual therapy, [id number]- Gait training, [phone]- Aquatic Therapy, 97014- Electrical stimulation (unattended), 97035- Ultrasound, Patient/Family education, Stair training, Taping, Dry Needling, DME instructions, Cryotherapy, and Moist heat   PLAN FOR NEXT SESSION: Next visit consider trigger point dry needling to gluteal and lower lumbar spine.  Progress exercises as tolerated.  Consider pal off press.  Consider rotational strengthening exercises. Review leg press.  Consider cone drill.  Consider cable walk.  Patient is  a gym member.  Dessie Coma, PT 01/22/2024, 4:45 PM

## 2024-01-23 ENCOUNTER — Encounter (HOSPITAL_BASED_OUTPATIENT_CLINIC_OR_DEPARTMENT_OTHER): Payer: Self-pay | Admitting: Physical Therapy

## 2024-01-28 ENCOUNTER — Encounter (HOSPITAL_BASED_OUTPATIENT_CLINIC_OR_DEPARTMENT_OTHER): Payer: Medicare Other | Admitting: Physical Therapy

## 2024-02-04 ENCOUNTER — Ambulatory Visit (HOSPITAL_BASED_OUTPATIENT_CLINIC_OR_DEPARTMENT_OTHER): Payer: Medicare Other | Attending: Student | Admitting: Physical Therapy

## 2024-02-04 DIAGNOSIS — M25552 Pain in left hip: Secondary | ICD-10-CM | POA: Insufficient documentation

## 2024-02-04 DIAGNOSIS — M25652 Stiffness of left hip, not elsewhere classified: Secondary | ICD-10-CM | POA: Insufficient documentation

## 2024-02-04 NOTE — Therapy (Signed)
OUTPATIENT PHYSICAL THERAPY LOWER EXTREMITY Discharge   Patient Name: Austin Harvey MRN: [id number] DOB:Apr 28, 1956, 68 y.o., male Today's Date: 02/04/2024  END OF SESSION:  PT End of Session - 02/04/24 1014     Visit Number 5    Number of Visits 8    Date for PT Re-Evaluation 02/14/24    PT Start Time 0925    PT Stop Time 1005    PT Time Calculation (min) 40 min               Past Medical History:  Diagnosis Date   Carpal tunnel syndrome    Coarctation of aorta    age 48   Elevated PSA    Heart murmur    systolic   Hyperlipidemia    Hypertension    Leukopenia    mild   Past Surgical History:  Procedure Laterality Date   COARCTATION OF AORTA REPAIR  06/1962   KNEE ARTHROSCOPY     ROTATOR CUFF REPAIR     right   Patient Active Problem List   Diagnosis Date Noted   Preop cardiovascular exam 04/18/2022   Coronary artery calcification 04/18/2022   Coarctation of the aorta, complex 04/18/2022    PCP: Merri Brunette MD  REFERRING PROVIDER: Eartha Inch PA   REFERRING DIAG: Right hip replacement   THERAPY DIAG:  Pain in left hip  Stiffness of left hip, not elsewhere classified  Rationale for Evaluation and Treatment: Rehabilitation  ONSET DATE: Post op anterior hip 09/30/2023   SUBJECTIVE:   SUBJECTIVE STATEMENT: The patient reports improvement from the last visit. He was a little sore the next day but it improved after that.   PERTINENT HISTORY: Bilateral knee OA, heart murmur, bilateral knee scope,  PAIN:  Are you having pain? Yes: NPRS scale: when it hurts 3-4/10  Pain location: left anterior hip  Pain description: aching  Aggravating factors: walking; abduction  Relieving factors: not doing activity that hurts   PRECAUTIONS: None  RED FLAGS: None   WEIGHT BEARING RESTRICTIONS: No  FALLS:  Has patient fallen in last 6 months? No  LIVING ENVIRONMENT: Steps to the bonus room. Since replacement steps are better.    OCCUPATION:   Retired   Presenter, broadcasting:  Riding the bike  Exercises int he gym      PLOF: Independent  PATIENT GOALS:    NEXT MD VISIT: Nothing at this time   OBJECTIVE:  Note: Objective measures were completed at Evaluation unless otherwise noted.  DIAGNOSTIC FINDINGS:   PATIENT SURVEYS:  FOTO    COGNITION: Overall cognitive status: Within functional limits for tasks assessed     SENSATION: Radiating pain into lateral and anterior hip     POSTURE: No Significant postural limitations  PALPATION: TTP in glut med and minimus  Mild TTP in lumbar spine    LOWER EXTREMITY ROM:  Passive ROM Right eval Left eval  Hip flexion  115 degrees with mild pain  Hip extension    Hip abduction    Hip adduction    Hip internal rotation  Full range but painful  Hip external rotation  Mild endrange restriction  Knee flexion    Knee extension    Ankle dorsiflexion    Ankle plantarflexion    Ankle inversion    Ankle eversion     (Blank rows = not tested)  LOWER EXTREMITY MMT:  MMT Right eval Left eval  Hip flexion 37.5 33.0  Hip extension    Hip abduction 43.3 37.4  Hip adduction    Hip internal rotation    Hip external rotation    Knee flexion    Knee extension 59.3 61.2  Ankle dorsiflexion    Ankle plantarflexion    Ankle inversion    Ankle eversion     (Blank rows = not tested)    GAIT: Normal gait  Single-leg stance equal left and right                                                                                                                              TREATMENT DATE:  2/12 Self care Discussed RPE and grading exercises with patient for gym program.  Discussed importance of meeting 60% of 1 rep max in order to build muscle.  Discussed how he can integrate that into his gym program.  TherEX: TRX squat 3 x 10 Dead lift 26 pound kettle bell from 4 inch position 3 x 10  Neuro re-ed   Rebounder 2 x 20 each leg Cable walk 45 pounds 2 x 10   Last visit:    Manual: All PROM performed with distraction to reduce pain and improve movement; skilled palpation of trigger points, Trigger point release to gluteals    Nuro-Re-ed   Eccentric step downs 2x10  2inch. Did well with hip but had pain in his knee Cone drill for rotational control x10  Reviewed how to progress and make more difficult.  Cable walk: 20 lbs RPE of 4 25 lbs REP of 6 Cuing for set up   Trigger Point Dry Needling  Initial Treatment: Pt instructed on Dry Needling rational, procedures, and possible side effects. Pt instructed to expect mild to moderate muscle soreness later in the day and/or into the next day.  Pt instructed in methods to reduce muscle soreness. Pt instructed to continue prescribed HEP. Patient was educated on signs and symptoms of infection and other risk factors and advised to seek medical attention should they occur.  Patient verbalized understanding of these instructions and education.   Patient Verbal Consent Given: Yes Education Handout Provided: Glut med 3x using a .30x50 needle  Muscles Treated: Left gluteal Electrical Stimulation Performed: No Treatment Response/Outcome: great twitch      1/9  Nu-step for warm up  Slow march with right leg    Initial Treatment: Pt instructed on Dry Needling rational, procedures, and possible side effects. Pt instructed to expect mild to moderate muscle soreness later in the day and/or into the next day.  Pt instructed in methods to reduce muscle soreness. Pt instructed to continue prescribed HEP.  Patient Verbal Consent Given: Yes Education Handout Provided: Yes Muscles Treated: glut medius and min 2x using a .30x50 needle  Electrical Stimulation Performed: No Treatment Response/Outcome: great twitch   Manual: skilled palpation of trigger points; trigger point release to medial and lateral hip.  Roller to anterior hip; review of self soft tissue mobilization.    Last visit:   Trigger Point  Dry  Needling  Initial Treatment: Pt instructed on Dry Needling rational, procedures, and possible side effects. Pt instructed to expect mild to moderate muscle soreness later in the day and/or into the next day.  Pt instructed in methods to reduce muscle soreness. Pt instructed to continue prescribed HEP.  Patient Verbal Consent Given: Yes Education Handout Provided: Yes Muscles Treated: glut medius and min 2x using a .30x50 needle  Electrical Stimulation Performed: No Treatment Response/Outcome: great twitch   Bridge with band 2x20 green  Hip abduction red 2x20  Reviewed glute stretch        Eval  Access Code: [email or web address] URL: [email or web address] Date: 12/19/2023 Prepared by: Lorayne Bender  Exercises - Supine Lower Trunk Rotation  - 1 x daily - 7 x weekly - 3 sets - 10 reps - Supine Piriformis Stretch with Foot on Ground  - 1 x daily - 7 x weekly - 3 sets - 3 reps - 20 sec  hold - Supine March  - 1 x daily - 7 x weekly - 3 sets - 10 reps - Supine Bridge with Resistance Band  - 1 x daily - 7 x weekly - 3 sets - 10 reps - Standing Glute Med Mobilization with Small Ball on Wall  - 1 x daily - 7 x weekly - 3 sets - 10 reps     PATIENT EDUCATION:  Education details: reviewed HEP, self soft tissue mobilization  Person educated: Patient Education method: Explanation, Demonstration, Tactile cues, Verbal cues, and Handouts Education comprehension: verbalized understanding, returned demonstration, verbal cues required, tactile cues required, and needs further education  HOME EXERCISE PROGRAM:    Access Code: [email or web address] URL: [email or web address] Date: 12/19/2023 Prepared by: Lorayne Bender  Exercises - Supine Lower Trunk Rotation  - 1 x daily - 7 x weekly - 3 sets - 10 reps - Supine Piriformis Stretch with Foot on Ground  - 1 x daily - 7 x weekly - 3 sets - 3 reps - 20 sec  hold - Supine March  - 1 x daily - 7 x weekly - 3 sets - 10 reps - Supine  Bridge with Resistance Band  - 1 x daily - 7 x weekly - 3 sets - 10 reps - Standing Glute Med Mobilization with Small Ball on Wall  - 1 x daily - 7 x weekly - 3 sets - 10 reps  ASSESSMENT:  CLINICAL IMPRESSION: The patient has made great progress. He has had no significant pain. We reviewed how to progress his exercises. He has been progressing his exercises on his own. We will discharge at this time. See below for goal specific progress.    Eval: Patient is a 68 year old male status post left anterior hip replacement on 09/30/2023.  He continues to have left-sided lateral hip and anterior hip pain.  The pain increases with standing and walking.  He also has increased pain with active hip abduction. He has spasming in his glut min and med. He has mild pain in his hip with lumbar extension and left right lumbar rotation. He would beneift from skilled therapy to reduce pain and .improve ability to continue active lifestyle.   OBJECTIVE IMPAIRMENTS: decreased activity tolerance, decreased ROM, decreased strength, increased muscle spasms, and pain.   ACTIVITY LIMITATIONS: bending, standing, squatting, and getting on a bke.   PARTICIPATION LIMITATIONS: community activity, yard work, and going to the gym   PERSONAL FACTORS: 1 comorbidity: bilateral knee surgery   are also affecting patient's functional outcome.  REHAB POTENTIAL: Excellent  CLINICAL DECISION MAKING: Stable/uncomplicated  EVALUATION COMPLEXITY: Low   GOALS: Goals reviewed with patient? Yes  SHORT TERM GOALS: Target date: 01/16/2024   Patient will demonstrate full hip IR on the left with Baseline: Goal status:  full today 2/12 achieved   2.  Patient will demonstrate pain free lumbar extension and rotation  Baseline:  Goal status:pain free achieved 2/12   3.  Patient will demonstrate equal hip abduction strength left versus right Baseline:  Goal status: not tested 2/12  4.  Patient will have baseline stretching and  strengthening program Baseline:  Goal status: achieved 2/12    LONG TERM GOALS: Target date: 02/13/2024    Patient will walk community distance without increased left hip pain Baseline:  Goal status: Achieved 2/12  2.  Patient will have full gym program Baseline:  Goal status: achieved 2/12 3.  Patient will demonstrate full pain-free range of motion of left hip in order to perform ADLs Baseline: achieved 2/12  PLAN:  PT FREQUENCY: 1-2x/week  PT DURATION: 8 weeks  PLANNED INTERVENTIONS: 97110-Therapeutic exercises, 97530- Therapeutic activity, 97112- Neuromuscular re-education, 97535- Self Care, 16109- Manual therapy, [id number]- Gait training, [phone]- Aquatic Therapy, 97014- Electrical stimulation (unattended), 97035- Ultrasound, Patient/Family education, Stair training, Taping, Dry Needling, DME instructions, Cryotherapy, and Moist heat   PLAN FOR NEXT SESSION: Next visit consider trigger point dry needling to gluteal and lower lumbar spine.  Progress exercises as tolerated.  Consider pal off press.  Consider rotational strengthening exercises. Review leg press.  Consider cone drill.  Consider cable walk.  Patient is a gym member.  Dessie Coma, PT 02/04/2024, 10:21 AM
